# Patient Record
Sex: Female | Born: 2005 | Race: Black or African American | Hispanic: No | Marital: Single | Smoking: Current every day smoker
Health system: Southern US, Community
[De-identification: ages and names within clinical notes are randomized; demographics above are authoritative.]

## PROBLEM LIST (undated history)

## (undated) ENCOUNTER — Other Ambulatory Visit (HOSPITAL_COMMUNITY): Admission: EM | Payer: MEDICAID

## (undated) DIAGNOSIS — K5900 Constipation, unspecified: Secondary | ICD-10-CM

## (undated) DIAGNOSIS — F329 Major depressive disorder, single episode, unspecified: Secondary | ICD-10-CM

## (undated) DIAGNOSIS — F419 Anxiety disorder, unspecified: Secondary | ICD-10-CM

## (undated) DIAGNOSIS — F29 Unspecified psychosis not due to a substance or known physiological condition: Secondary | ICD-10-CM

## (undated) DIAGNOSIS — F909 Attention-deficit hyperactivity disorder, unspecified type: Secondary | ICD-10-CM

## (undated) DIAGNOSIS — F32A Depression, unspecified: Secondary | ICD-10-CM

## (undated) DIAGNOSIS — E669 Obesity, unspecified: Secondary | ICD-10-CM

## (undated) DIAGNOSIS — L309 Dermatitis, unspecified: Secondary | ICD-10-CM

## (undated) DIAGNOSIS — H539 Unspecified visual disturbance: Secondary | ICD-10-CM

## (undated) DIAGNOSIS — R0981 Nasal congestion: Secondary | ICD-10-CM

## (undated) DIAGNOSIS — J353 Hypertrophy of tonsils with hypertrophy of adenoids: Secondary | ICD-10-CM

## (undated) HISTORY — DX: Attention-deficit hyperactivity disorder, unspecified type: F90.9

## (undated) HISTORY — PX: TONSILLECTOMY: SUR1361

## (undated) HISTORY — DX: Obesity, unspecified: E66.9

---
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
## 2024-01-05 ENCOUNTER — Encounter (HOSPITAL_COMMUNITY): Payer: Self-pay

## 2024-01-05 ENCOUNTER — Ambulatory Visit (HOSPITAL_COMMUNITY): Payer: Medicaid Other | Admitting: Student in an Organized Health Care Education/Training Program

## 2024-01-05 NOTE — Progress Notes (Signed)
 Patient refusing to answer phone. Called Great grandmother. Patient has been staying in her room and eating on occasion. Patient has been refusing to answer any calls. Patient has been drawing, but is not using her phone. Patient did go to her therapist on Monday. Great-grandmother has also called [facility] to try and get her intensive therapy as well. Have again provided Grandmother the number to IOP that was left on [employer]' VM by [employer]. Grandmother has not been able to pick up the medication yet as well.   PGY-4 Reggie Rice, MD

## 2024-01-10 ENCOUNTER — Telehealth (HOSPITAL_COMMUNITY): Payer: Self-pay | Admitting: Professional

## 2024-01-15 ENCOUNTER — Telehealth (HOSPITAL_COMMUNITY): Payer: Self-pay | Admitting: Student in an Organized Health Care Education/Training Program

## 2024-01-15 ENCOUNTER — Telehealth (HOSPITAL_COMMUNITY): Payer: Self-pay | Admitting: Professional

## 2024-01-18 ENCOUNTER — Other Ambulatory Visit (HOSPITAL_COMMUNITY): Payer: Self-pay | Admitting: Psychiatry

## 2024-01-18 ENCOUNTER — Telehealth (HOSPITAL_COMMUNITY): Payer: Self-pay | Admitting: *Deleted

## 2024-01-18 ENCOUNTER — Telehealth (HOSPITAL_COMMUNITY): Payer: Self-pay

## 2024-01-18 DIAGNOSIS — F319 Bipolar disorder, unspecified: Secondary | ICD-10-CM

## 2024-01-18 MED ORDER — QUETIAPINE FUMARATE 200 MG PO TABS: 200.0000 mg | ORAL_TABLET | Freq: Every day | ORAL | 3 refills | Status: DC

## 2024-01-18 NOTE — Telephone Encounter (Signed)
Patients Grandmother called stating that she is unable to pick up patients Seroquel. She is asking for Seroquel 200mg . Medication is no longer on the patients active medication list. Also states that a prior authorization is needed per [facility]. Grandmother is very upset stating that she is getting worse and needs some help.
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
## 2024-01-25 ENCOUNTER — Other Ambulatory Visit (HOSPITAL_COMMUNITY): Payer: Self-pay

## 2024-01-25 ENCOUNTER — Telehealth (HOSPITAL_COMMUNITY): Payer: Self-pay

## 2024-01-25 NOTE — Telephone Encounter (Signed)
Pa has been started on this pt waiting for approval. 01/25/24

## 2024-01-25 NOTE — Telephone Encounter (Signed)
Pa approved today for Seroquel

## 2024-01-25 NOTE — Telephone Encounter (Signed)
Thank you for this update

## 2024-01-26 ENCOUNTER — Other Ambulatory Visit: Payer: Self-pay

## 2024-01-26 ENCOUNTER — Other Ambulatory Visit (HOSPITAL_COMMUNITY)
Admission: EM | Admit: 2024-01-26 | Discharge: 2024-01-29 | Disposition: A | Discharge status: Other Acute Inpatient Hospital | Payer: MEDICAID | Source: Home / Self Care | Admitting: Student in an Organized Health Care Education/Training Program

## 2024-01-26 ENCOUNTER — Other Ambulatory Visit (HOSPITAL_COMMUNITY): Admission: EM | Admit: 2024-01-26 | Discharge: 2024-01-26 | Payer: MEDICAID | Attending: Psychiatry

## 2024-01-26 ENCOUNTER — Encounter (HOSPITAL_COMMUNITY): Payer: Self-pay | Admitting: Student in an Organized Health Care Education/Training Program

## 2024-01-26 ENCOUNTER — Ambulatory Visit (INDEPENDENT_AMBULATORY_CARE_PROVIDER_SITE_OTHER): Payer: Medicaid Other | Admitting: Student in an Organized Health Care Education/Training Program

## 2024-01-26 VITALS — BP 140/79 | HR 91 | Wt 201.0 lb

## 2024-01-26 DIAGNOSIS — F411 Generalized anxiety disorder: Secondary | ICD-10-CM

## 2024-01-26 DIAGNOSIS — Z91148 Patient's other noncompliance with medication regimen for other reason: Secondary | ICD-10-CM | POA: Insufficient documentation

## 2024-01-26 DIAGNOSIS — F4321 Adjustment disorder with depressed mood: Secondary | ICD-10-CM | POA: Diagnosis not present

## 2024-01-26 DIAGNOSIS — F609 Personality disorder, unspecified: Secondary | ICD-10-CM

## 2024-01-26 DIAGNOSIS — Z9151 Personal history of suicidal behavior: Secondary | ICD-10-CM | POA: Insufficient documentation

## 2024-01-26 DIAGNOSIS — Z62810 Personal history of physical and sexual abuse in childhood: Secondary | ICD-10-CM | POA: Insufficient documentation

## 2024-01-26 DIAGNOSIS — F4010 Social phobia, unspecified: Secondary | ICD-10-CM | POA: Insufficient documentation

## 2024-01-26 DIAGNOSIS — F4310 Post-traumatic stress disorder, unspecified: Secondary | ICD-10-CM

## 2024-01-26 DIAGNOSIS — F319 Bipolar disorder, unspecified: Secondary | ICD-10-CM

## 2024-01-26 DIAGNOSIS — F1220 Cannabis dependence, uncomplicated: Secondary | ICD-10-CM

## 2024-01-26 DIAGNOSIS — R45851 Suicidal ideations: Secondary | ICD-10-CM | POA: Insufficient documentation

## 2024-01-26 DIAGNOSIS — R4588 Nonsuicidal self-harm: Secondary | ICD-10-CM

## 2024-01-26 DIAGNOSIS — Z79899 Other long term (current) drug therapy: Secondary | ICD-10-CM | POA: Insufficient documentation

## 2024-01-26 DIAGNOSIS — Z658 Other specified problems related to psychosocial circumstances: Secondary | ICD-10-CM | POA: Insufficient documentation

## 2024-01-26 DIAGNOSIS — F6089 Other specific personality disorders: Secondary | ICD-10-CM | POA: Insufficient documentation

## 2024-01-26 DIAGNOSIS — F3160 Bipolar disorder, current episode mixed, unspecified: Secondary | ICD-10-CM | POA: Insufficient documentation

## 2024-01-26 DIAGNOSIS — J45909 Unspecified asthma, uncomplicated: Secondary | ICD-10-CM | POA: Diagnosis present

## 2024-01-26 DIAGNOSIS — F909 Attention-deficit hyperactivity disorder, unspecified type: Secondary | ICD-10-CM | POA: Insufficient documentation

## 2024-01-26 DIAGNOSIS — F1210 Cannabis abuse, uncomplicated: Secondary | ICD-10-CM

## 2024-01-26 DIAGNOSIS — F3110 Bipolar disorder, current episode manic without psychotic features, unspecified: Secondary | ICD-10-CM | POA: Diagnosis not present

## 2024-01-26 DIAGNOSIS — F3130 Bipolar disorder, current episode depressed, mild or moderate severity, unspecified: Secondary | ICD-10-CM | POA: Diagnosis present

## 2024-01-26 LAB — COMPREHENSIVE METABOLIC PANEL
ALT: 21 U/L (ref 0–44)
AST: 19 U/L (ref 15–41)
Albumin: 3.5 g/dL (ref 3.5–5.0)
Alkaline Phosphatase: 76 U/L (ref 38–126)
Anion gap: 10 (ref 5–15)
BUN: 8 mg/dL (ref 6–20)
CO2: 21 mmol/L — ABNORMAL LOW (ref 22–32)
Calcium: 9.4 mg/dL (ref 8.9–10.3)
Chloride: 108 mmol/L (ref 98–111)
Creatinine, Ser: 0.71 mg/dL (ref 0.44–1.00)
GFR, Estimated: >60 mL/min (ref >60)
Glucose, Bld: 121 mg/dL — ABNORMAL HIGH (ref 70–99)
Potassium: 4.6 mmol/L (ref 3.5–5.1)
Sodium: 139 mmol/L (ref 135–145)
Total Bilirubin: <0.2 mg/dL (ref 0.0–1.2)
Total Protein: 6.7 g/dL (ref 6.5–8.1)

## 2024-01-26 LAB — CBC WITH DIFFERENTIAL/PLATELET
Abs Immature Granulocytes: 0.01 10*3/uL (ref 0.00–0.07)
Basophils Absolute: 0.1 10*3/uL (ref 0.0–0.1)
Basophils Relative: 1 %
Eosinophils Absolute: 0.3 10*3/uL (ref 0.0–0.5)
Eosinophils Relative: 3 %
HCT: 38.5 % (ref 36.0–46.0)
Hemoglobin: 12.5 g/dL (ref 12.0–15.0)
Immature Granulocytes: 0 %
Lymphocytes Relative: 41 %
Lymphs Abs: 3.3 10*3/uL (ref 0.7–4.0)
MCH: 26.8 pg (ref 26.0–34.0)
MCHC: 32.5 g/dL (ref 30.0–36.0)
MCV: 82.6 fL (ref 80.0–100.0)
Monocytes Absolute: 0.3 10*3/uL (ref 0.1–1.0)
Monocytes Relative: 4 %
Neutro Abs: 4 10*3/uL (ref 1.7–7.7)
Neutrophils Relative %: 51 %
Platelets: 272 10*3/uL (ref 150–400)
RBC: 4.66 MIL/uL (ref 3.87–5.11)
RDW: 14.5 % (ref 11.5–15.5)
WBC: 7.9 10*3/uL (ref 4.0–10.5)
nRBC: 0 % (ref 0.0–0.2)

## 2024-01-26 LAB — TSH: TSH: 1.357 u[IU]/mL (ref 0.350–4.500)

## 2024-01-26 MED ORDER — QUETIAPINE FUMARATE 200 MG PO TABS: 200.0000 mg | ORAL_TABLET | Freq: Every day | ORAL | Status: DC

## 2024-01-26 MED ORDER — ACETAMINOPHEN 325 MG PO TABS: 650.0000 mg | ORAL_TABLET | Freq: Four times a day (QID) | ORAL | Status: DC | PRN

## 2024-01-26 MED ORDER — HYDROXYZINE HCL 25 MG PO TABS: 25.0000 mg | ORAL_TABLET | Freq: Three times a day (TID) | ORAL | Status: DC | PRN

## 2024-01-26 MED ORDER — FLUOXETINE HCL 20 MG PO CAPS: 20.0000 mg | ORAL_CAPSULE | Freq: Every day | ORAL | Status: DC

## 2024-01-26 MED ORDER — TRAZODONE HCL 100 MG PO TABS: 100.0000 mg | ORAL_TABLET | Freq: Every evening | ORAL | Status: DC | PRN

## 2024-01-26 MED ORDER — ALUM & MAG HYDROXIDE-SIMETH 200-200-20 MG/5ML PO SUSP
30.0000 mL | ORAL | Status: DC | PRN
Start: 2024-01-26 — End: 2024-01-29
  Administered 2024-01-27: 30 mL via ORAL
  Filled 2024-01-26: qty 30

## 2024-01-26 MED ORDER — MAGNESIUM HYDROXIDE 400 MG/5ML PO SUSP: 30.0000 mL | Freq: Every day | ORAL | Status: DC | PRN

## 2024-01-26 MED ORDER — QUETIAPINE FUMARATE 100 MG PO TABS: 100.0000 mg | ORAL_TABLET | Freq: Every day | ORAL | Status: DC

## 2024-01-26 MED ORDER — MAGNESIUM HYDROXIDE 400 MG/5ML PO SUSP
30.0000 mL | Freq: Every day | ORAL | Status: DC | PRN
Start: 2024-01-26 — End: 2024-01-29

## 2024-01-26 MED ORDER — DIPHENHYDRAMINE HCL 25 MG PO CAPS: 25.0000 mg | ORAL_CAPSULE | Freq: Four times a day (QID) | ORAL | Status: DC | PRN

## 2024-01-26 MED ORDER — ALBUTEROL SULFATE HFA 108 (90 BASE) MCG/ACT IN AERS: 2.0000 | INHALATION_SPRAY | RESPIRATORY_TRACT | Status: DC | PRN

## 2024-01-26 MED ORDER — CHOLECALCIFEROL 10 MCG (400 UNIT) PO TABS
400.0000 [IU] | ORAL_TABLET | Freq: Every day | ORAL | Status: DC
  Administered 2024-01-26 – 2024-01-29 (×4): 400 [IU] via ORAL
  Filled 2024-01-26 (×4): qty 1

## 2024-01-26 MED ORDER — QUETIAPINE FUMARATE 100 MG PO TABS
100.0000 mg | ORAL_TABLET | Freq: Every day | ORAL | Status: DC
  Administered 2024-01-26 – 2024-01-27 (×2): 100 mg via ORAL
  Filled 2024-01-26 (×2): qty 1

## 2024-01-26 MED ORDER — FLUOXETINE HCL 20 MG PO CAPS
20.0000 mg | ORAL_CAPSULE | Freq: Every day | ORAL | Status: DC
  Administered 2024-01-26 – 2024-01-28 (×3): 20 mg via ORAL
  Filled 2024-01-26 (×3): qty 1

## 2024-01-26 MED ORDER — TRAZODONE HCL 100 MG PO TABS
100.0000 mg | ORAL_TABLET | Freq: Every day | ORAL | Status: DC
  Administered 2024-01-27: 100 mg via ORAL
  Filled 2024-01-26 (×2): qty 1

## 2024-01-26 MED ORDER — MOMETASONE FURO-FORMOTEROL FUM 100-5 MCG/ACT IN AERO: 2.0000 | INHALATION_SPRAY | Freq: Once | RESPIRATORY_TRACT | Status: DC

## 2024-01-26 MED ORDER — POLYETHYLENE GLYCOL 3350 17 G PO PACK: 17.0000 g | PACK | Freq: Every day | ORAL | Status: DC | PRN

## 2024-01-26 MED ORDER — ALBUTEROL SULFATE HFA 108 (90 BASE) MCG/ACT IN AERS: 2.0000 | INHALATION_SPRAY | Freq: Four times a day (QID) | RESPIRATORY_TRACT | Status: DC | PRN

## 2024-01-26 MED ORDER — ALUM & MAG HYDROXIDE-SIMETH 200-200-20 MG/5ML PO SUSP: 30.0000 mL | ORAL | Status: DC | PRN

## 2024-01-26 MED ORDER — TRAZODONE HCL 50 MG PO TABS
50.0000 mg | ORAL_TABLET | Freq: Every evening | ORAL | Status: DC | PRN
  Administered 2024-01-26: 50 mg via ORAL
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
## 2024-01-26 NOTE — Discharge Instructions (Signed)
 Transfer to [facility]

## 2024-01-26 NOTE — [facility] Group Notes (Signed)
SPIRITUALITY GROUP NOTE  Spirituality group facilitated by Wilkie Aye, MDiv, [facility].  Group Description: Group focused on topic of hope. Patients participated in facilitated discussion around topic, connecting with one another around experiences and definitions for hope. Group members engaged with visual explorer photos, reflecting on what hope looks like for them today. Group engaged in discussion around how their definitions of hope are present today in hospital.  Modalities: Psycho-social ed, Adlerian, Narrative, MI  Patient Progress: Present throughout group.  Named feeling disconnected from word hope.
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
## 2024-01-26 NOTE — ED Notes (Signed)
Pt is transferred to [facility].

## 2024-01-26 NOTE — ED Notes (Signed)
Patient is adjusting well to unit.  She is calm and pleasant without distress or complaint.  Makes needs known.  Will monitor.

## 2024-01-26 NOTE — ED Notes (Signed)
Pt isolates to room, a/o, flat affect. Denies SI/. HI/AVH. Contracts for safety. No noted distress. Will continue to monitor for safety

## 2024-01-26 NOTE — ED Notes (Signed)
Patient admitted to [facility] from out patient provider due to medication non adherence and family issues.  Patient calm and cooperative with admission process.  Soft spoken and agreeable.  Patient was oriented to the unit and shown to her room.  She was then given lunch.  She is now sitting in the day room eating.  Patient denied avh shi or plan and will seek staff if she is overwhelmed.  Patient has many healed scars on arms and both thighs from cutting however she states she has not done this in a long time.  No visible fresh cuts. Patient also reports smoking THC daily.  Will monitor and provide a safe environment.

## 2024-01-27 DIAGNOSIS — F3110 Bipolar disorder, current episode manic without psychotic features, unspecified: Secondary | ICD-10-CM | POA: Diagnosis not present

## 2024-01-27 DIAGNOSIS — F4310 Post-traumatic stress disorder, unspecified: Secondary | ICD-10-CM | POA: Diagnosis not present

## 2024-01-27 DIAGNOSIS — F909 Attention-deficit hyperactivity disorder, unspecified type: Secondary | ICD-10-CM | POA: Diagnosis not present

## 2024-01-27 DIAGNOSIS — F411 Generalized anxiety disorder: Secondary | ICD-10-CM | POA: Diagnosis not present

## 2024-01-27 LAB — POCT URINE DRUG SCREEN - MANUAL ENTRY (I-SCREEN)
POC Amphetamine UR: NOT DETECTED
POC Buprenorphine (BUP): NOT DETECTED
POC Cocaine UR: NOT DETECTED
POC Marijuana UR: POSITIVE — AB
POC Methadone UR: NOT DETECTED
POC Methamphetamine UR: NOT DETECTED
POC Morphine: NOT DETECTED
POC Oxazepam (BZO): NOT DETECTED
POC Oxycodone UR: NOT DETECTED
POC Secobarbital (BAR): NOT DETECTED

## 2024-01-27 LAB — POC URINE PREG, ED: Preg Test, Ur: NEGATIVE

## 2024-01-27 MED ORDER — HYDROXYZINE HCL 25 MG PO TABS
25.0000 mg | ORAL_TABLET | Freq: Three times a day (TID) | ORAL | Status: DC | PRN
  Administered 2024-01-28 (×2): 25 mg via ORAL
  Filled 2024-01-27 (×2): qty 1

## 2024-01-27 NOTE — ED Notes (Signed)
Pt observed/assessed in room sleeping. RR even and unlabored, appearing in no noted distress. Environmental check complete, will continue to monitor for safety 
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
## 2024-01-27 NOTE — ED Notes (Signed)
Providers made aware of B/P 104/84 with fluid encouragement.

## 2024-01-27 NOTE — ED Notes (Signed)
Patient resting quietly in bed, easily aroused. Patient unable to express mood citing "I don't know". Patient affect is flat. Patient denies SI, HI, AVH. Patient does not appear to be responsive to internal stimuli. Patient requested to rest today.
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
## 2024-01-27 NOTE — ED Notes (Signed)
Pt complained of the group topics we've been having, she feels left out because most of the all related to addiction.. Pt denies SI/HI/AVH. No acute distress noted. Will continue to monitor for safety and provide support.

## 2024-01-28 DIAGNOSIS — F909 Attention-deficit hyperactivity disorder, unspecified type: Secondary | ICD-10-CM | POA: Diagnosis not present

## 2024-01-28 DIAGNOSIS — F4310 Post-traumatic stress disorder, unspecified: Secondary | ICD-10-CM | POA: Diagnosis not present

## 2024-01-28 DIAGNOSIS — F4010 Social phobia, unspecified: Secondary | ICD-10-CM | POA: Diagnosis present

## 2024-01-28 DIAGNOSIS — F3110 Bipolar disorder, current episode manic without psychotic features, unspecified: Secondary | ICD-10-CM | POA: Diagnosis not present

## 2024-01-28 DIAGNOSIS — F411 Generalized anxiety disorder: Secondary | ICD-10-CM | POA: Diagnosis not present

## 2024-01-28 DIAGNOSIS — J45909 Unspecified asthma, uncomplicated: Secondary | ICD-10-CM | POA: Diagnosis present

## 2024-01-28 MED ORDER — QUETIAPINE FUMARATE 50 MG PO TABS
50.0000 mg | ORAL_TABLET | Freq: Every day | ORAL | Status: DC
  Administered 2024-01-28: 50 mg via ORAL
  Filled 2024-01-28: qty 1

## 2024-01-28 MED ORDER — CLONIDINE HCL 0.1 MG PO TABS
0.1000 mg | ORAL_TABLET | ORAL | Status: DC
  Administered 2024-01-28 – 2024-01-29 (×4): 0.1 mg via ORAL
  Filled 2024-01-28 (×5): qty 1

## 2024-01-28 MED ORDER — TRAZODONE HCL 50 MG PO TABS
50.0000 mg | ORAL_TABLET | Freq: Every day | ORAL | Status: DC
  Administered 2024-01-28: 50 mg via ORAL
  Filled 2024-01-28: qty 1

## 2024-01-28 MED ORDER — CLONIDINE HCL 0.1 MG PO TABS
0.1000 mg | ORAL_TABLET | Freq: Three times a day (TID) | ORAL | Status: DC | PRN
  Administered 2024-01-28: 0.1 mg via ORAL
  Filled 2024-01-28: qty 1

## 2024-01-28 MED ORDER — FLUOXETINE HCL 20 MG PO CAPS
40.0000 mg | ORAL_CAPSULE | ORAL | Status: DC
Start: 2024-01-29 — End: 2024-01-29
  Administered 2024-01-29: 40 mg via ORAL
  Filled 2024-01-28: qty 2
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
## 2024-01-28 NOTE — ED Notes (Addendum)
 Patient is sleeping. Respirations equal and unlabored, skin warm and dry. No change in assessment or acuity. Routine safety checks conducted according to facility protocol. Will continue to monitor for safety.

## 2024-01-28 NOTE — ED Notes (Signed)
Patient A&O x 4, calm, cooperative. Patient reports slight irritability from hearing a basketball bounce when attempting to lay down. The patient endorses she knows it's not realistic. Patient also endorses restless r/t the situation of the basketball. Patient observed to be teary-eyed and anxious when talking of the basketball. Patient denies SI, HI, VH. Patient allowed to voice concerns. Morning medications with PRN Atarax administered.

## 2024-01-28 NOTE — ED Notes (Signed)
 Patient was provided lunch
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
## 2024-01-28 NOTE — ED Notes (Signed)
 Patient is sleeping. Respirations equal and unlabored, skin warm and dry. No change in assessment or acuity. Routine safety checks conducted according to facility protocol. Will continue to monitor for safety.
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
## 2024-01-28 NOTE — ED Notes (Addendum)
Pt is in the bedroom calm, awake and composed, still laying on the floor. Respirations are even and unlabored. NAD unlabored. Will continue to monitor for safety.

## 2024-01-28 NOTE — ED Notes (Signed)
Patient on the phone talking to a family member. Pleasant looking at the moment. NAD, respirations are even and unlabored. Will monitor for safety.

## 2024-01-29 DIAGNOSIS — F909 Attention-deficit hyperactivity disorder, unspecified type: Secondary | ICD-10-CM | POA: Diagnosis not present

## 2024-01-29 DIAGNOSIS — F3110 Bipolar disorder, current episode manic without psychotic features, unspecified: Secondary | ICD-10-CM | POA: Diagnosis not present

## 2024-01-29 DIAGNOSIS — F411 Generalized anxiety disorder: Secondary | ICD-10-CM | POA: Diagnosis not present

## 2024-01-29 DIAGNOSIS — F4310 Post-traumatic stress disorder, unspecified: Secondary | ICD-10-CM | POA: Diagnosis not present

## 2024-01-29 MED ORDER — TRAZODONE HCL 50 MG PO TABS: 50.0000 mg | ORAL_TABLET | Freq: Every day | ORAL | Status: DC

## 2024-01-29 MED ORDER — FLUOXETINE HCL 40 MG PO CAPS: 40.0000 mg | ORAL_CAPSULE | ORAL | Status: DC

## 2024-01-29 MED ORDER — QUETIAPINE FUMARATE 50 MG PO TABS: 50.0000 mg | ORAL_TABLET | Freq: Every day | ORAL | Status: DC

## 2024-01-29 NOTE — ED Notes (Signed)
Patient remains asleep in bed.  She did not want to eat breakfast this morning.  Patient observed with mattress on the floor where she put it last night after believing someone was "playing basketball" in her room.  Patient without distress at this time. Will monitor.
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
## 2024-01-29 NOTE — Discharge Instructions (Addendum)
 Please continue to take medications as directed. If your symptoms return, worsen, or persist please call your 911, report to local ER, or contact crisis hotline. Please do not drink alcohol or use any illegal substances while taking prescription medications.

## 2024-01-29 NOTE — Progress Notes (Signed)
Pt has been accepted to [facility] on 01/29/2024 Bed assignment: 600 UNIT   Pt meets inpatient criteria per: Estill Cotta NP  Attending Physician will be: Sherrian Divers MD  Report can be called to: [phone]  Pt can arrive ASAP  Care Team Notified: Estill Cotta NP Staci Acosta LCSWA   [place] Addisyn Leclaire LCSW-A   01/29/2024 3:20 PM

## 2024-01-29 NOTE — ED Notes (Signed)
Pt is in the bedroom calm and sleeping. NAD Respirations are even and unlabored. Will continue to monitor for safety.
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
## 2024-01-29 NOTE — Progress Notes (Signed)
LCSW Progress Note  [id number]   [name]  01/29/2024  2:20 PM  Description:   Inpatient Psychiatric Referral  Patient was recommended inpatient per Estill Cotta NP). There are no available beds at [facility], per [facility] Rona Ravens RN. Patient was referred to the following out of network facilities:    Destination  Service Provider Address Phone Fax  [facility] Patient Placement [facility], [place] [phone]  [facility] [address] [place] [address] [phone]  [facility] [address], [place] [address] [phone]  [facility] [address] [place], [place] [address] [phone]  [facility] [address] [place], [place] [address] [phone]  [facility] [address] [place] [address] [phone]  [facility] [address] [place], [place] [address] [phone]  [facility] [address], [place] [address] [phone]  [facility] [address], [place] [address] [phone]  [facility] [address], [place] [address] [phone]  [facility] [address] [place] [address] [phone]  [facility] EFAX [address] [place], [place] [phone]  [facility] [address], [place] [address] [phone]  [facility] [address] [place] [address] [phone]  [facility] [address] [place] [address] [phone]      Situation ongoing, CSW to continue following and  update chart as more information becomes available.      [place] Cyanna Neace, MSW, LCSW  01/29/2024 2:20 PM
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
## 2024-01-30 ENCOUNTER — Telehealth (HOSPITAL_COMMUNITY): Payer: Self-pay | Admitting: Student in an Organized Health Care Education/Training Program

## 2024-02-02 NOTE — Telephone Encounter (Signed)
 Unfortunately, I do not have a ROI to speak with the grandmother and I am also not the provider who facilitated the transfer or care for the patient that great-grandmother is referring to.

## 2024-02-16 ENCOUNTER — Other Ambulatory Visit (HOSPITAL_COMMUNITY): Payer: Self-pay | Admitting: Student in an Organized Health Care Education/Training Program

## 2024-02-16 ENCOUNTER — Ambulatory Visit (HOSPITAL_COMMUNITY): Payer: MEDICAID | Admitting: Student in an Organized Health Care Education/Training Program

## 2024-02-16 ENCOUNTER — Encounter (HOSPITAL_COMMUNITY): Payer: Self-pay

## 2024-02-16 ENCOUNTER — Telehealth (HOSPITAL_COMMUNITY): Payer: Self-pay | Admitting: Student in an Organized Health Care Education/Training Program

## 2024-02-16 DIAGNOSIS — F609 Personality disorder, unspecified: Secondary | ICD-10-CM

## 2024-02-16 DIAGNOSIS — F3110 Bipolar disorder, current episode manic without psychotic features, unspecified: Secondary | ICD-10-CM

## 2024-02-16 DIAGNOSIS — F4310 Post-traumatic stress disorder, unspecified: Secondary | ICD-10-CM

## 2024-02-16 MED ORDER — QUETIAPINE FUMARATE 50 MG PO TABS: 50.0000 mg | ORAL_TABLET | Freq: Every day | ORAL | 0 refills | Status: DC

## 2024-02-16 MED ORDER — TRAZODONE HCL 50 MG PO TABS
50.0000 mg | ORAL_TABLET | Freq: Every day | ORAL | 0 refills | Status: DC
Start: 2024-02-16 — End: 2024-02-21

## 2024-02-16 MED ORDER — FLUOXETINE HCL 40 MG PO CAPS: 40.0000 mg | ORAL_CAPSULE | ORAL | 0 refills | Status: DC

## 2024-02-16 NOTE — Progress Notes (Signed)
 Patinet missed apt and is now returning to need for walk-in to get appt.Will send in 30 day supply of medications at the doses last recorded when patinet was in [facility].   PGY-4  Eliseo Gum, MD

## 2024-02-16 NOTE — Telephone Encounter (Signed)
 Provider called grandmother. Grandmother reports that patient is having more outbursts and is upset that she was hospitalized. Grandmother reports that patient is refusing to attend therapy and psychiatric appts. Did discuss that patient will have to come back as a walk-in. [name] understood. [name] also endorsed that she has been talking to [facility] with Case Manager about additional social services. Patient will be supplied with a 30 day supply of her medications per protocol.

## 2024-02-16 NOTE — Progress Notes (Unsigned)
 Patient Randie Heinz grandmother called to make appt virtual. Provider attempted to contact patient 2x by phone and sent virtual link to patient's phone and patient did not answer.    PGY-4 Eliseo Gum, MD

## 2024-02-19 ENCOUNTER — Telehealth (HOSPITAL_COMMUNITY): Payer: Self-pay

## 2024-02-19 DIAGNOSIS — F3110 Bipolar disorder, current episode manic without psychotic features, unspecified: Secondary | ICD-10-CM

## 2024-02-19 NOTE — Telephone Encounter (Signed)
 Hello,    Pt/ Pharmacy is requesting for a refill of 30.0 TRAZODONE 50 MG .  Next app 3/10, last seen 02/16/2024

## 2024-02-21 ENCOUNTER — Ambulatory Visit (HOSPITAL_COMMUNITY)
Admission: EM | Admit: 2024-02-21 | Discharge: 2024-02-22 | Disposition: A | Discharge status: Home / Self Care | Payer: MEDICAID | Attending: Nurse Practitioner | Admitting: Nurse Practitioner

## 2024-02-21 DIAGNOSIS — F4310 Post-traumatic stress disorder, unspecified: Secondary | ICD-10-CM | POA: Insufficient documentation

## 2024-02-21 DIAGNOSIS — Z79899 Other long term (current) drug therapy: Secondary | ICD-10-CM | POA: Insufficient documentation

## 2024-02-21 DIAGNOSIS — F319 Bipolar disorder, unspecified: Secondary | ICD-10-CM | POA: Insufficient documentation

## 2024-02-21 DIAGNOSIS — F488 Other specified nonpsychotic mental disorders: Secondary | ICD-10-CM | POA: Diagnosis not present

## 2024-02-21 DIAGNOSIS — F3110 Bipolar disorder, current episode manic without psychotic features, unspecified: Secondary | ICD-10-CM

## 2024-02-21 DIAGNOSIS — R4589 Other symptoms and signs involving emotional state: Secondary | ICD-10-CM

## 2024-02-21 LAB — COMPREHENSIVE METABOLIC PANEL
ALT: 19 U/L (ref 0–44)
AST: 19 U/L (ref 15–41)
Albumin: 3.8 g/dL (ref 3.5–5.0)
Alkaline Phosphatase: 71 U/L (ref 38–126)
Anion gap: 17 — ABNORMAL HIGH (ref 5–15)
BUN: 7 mg/dL (ref 6–20)
CO2: 19 mmol/L — ABNORMAL LOW (ref 22–32)
Calcium: 10.3 mg/dL (ref 8.9–10.3)
Chloride: 105 mmol/L (ref 98–111)
Creatinine, Ser: 0.8 mg/dL (ref 0.44–1.00)
GFR, Estimated: >60 mL/min (ref >60)
Glucose, Bld: 88 mg/dL (ref 70–99)
Potassium: 4 mmol/L (ref 3.5–5.1)
Sodium: 141 mmol/L (ref 135–145)
Total Bilirubin: 0.4 mg/dL (ref 0.0–1.2)
Total Protein: 7.6 g/dL (ref 6.5–8.1)

## 2024-02-21 LAB — CBC WITH DIFFERENTIAL/PLATELET
Abs Immature Granulocytes: 0.04 10*3/uL (ref 0.00–0.07)
Basophils Absolute: 0.1 10*3/uL (ref 0.0–0.1)
Basophils Relative: 1 %
Eosinophils Absolute: 0 10*3/uL (ref 0.0–0.5)
Eosinophils Relative: 0 %
HCT: 39 % (ref 36.0–46.0)
Hemoglobin: 12.7 g/dL (ref 12.0–15.0)
Immature Granulocytes: 0 %
Lymphocytes Relative: 26 %
Lymphs Abs: 2.7 10*3/uL (ref 0.7–4.0)
MCH: 26.6 pg (ref 26.0–34.0)
MCHC: 32.6 g/dL (ref 30.0–36.0)
MCV: 81.8 fL (ref 80.0–100.0)
Monocytes Absolute: 0.4 10*3/uL (ref 0.1–1.0)
Monocytes Relative: 4 %
Neutro Abs: 7.1 10*3/uL (ref 1.7–7.7)
Neutrophils Relative %: 69 %
Platelets: 339 10*3/uL (ref 150–400)
RBC: 4.77 MIL/uL (ref 3.87–5.11)
RDW: 14.2 % (ref 11.5–15.5)
WBC: 10.3 10*3/uL (ref 4.0–10.5)
nRBC: 0 % (ref 0.0–0.2)

## 2024-02-21 LAB — HEMOGLOBIN A1C
Hgb A1c MFr Bld: 5.2 % (ref 4.8–5.6)
Mean Plasma Glucose: 102.54 mg/dL

## 2024-02-21 LAB — LIPID PANEL
Cholesterol: 125 mg/dL (ref 0–169)
HDL: 48 mg/dL (ref >40)
LDL Cholesterol: 66 mg/dL (ref 0–99)
Total CHOL/HDL Ratio: 2.6 ratio
Triglycerides: 57 mg/dL (ref <150)
VLDL: 11 mg/dL (ref 0–40)

## 2024-02-21 LAB — TSH: TSH: 2.661 u[IU]/mL (ref 0.350–4.500)

## 2024-02-21 LAB — ETHANOL: Alcohol, Ethyl (B): <10 mg/dL (ref <10)

## 2024-02-21 MED ORDER — LORAZEPAM 1 MG PO TABS: 1.0000 mg | ORAL_TABLET | Freq: Once | ORAL | Status: DC

## 2024-02-21 MED ORDER — LORAZEPAM 2 MG/ML IJ SOLN: 2.0000 mg | Freq: Three times a day (TID) | INTRAMUSCULAR | Status: DC | PRN

## 2024-02-21 MED ORDER — ARIPIPRAZOLE 15 MG PO TABS
15.0000 mg | ORAL_TABLET | Freq: Once | ORAL | Status: AC
  Administered 2024-02-21: 15 mg via ORAL
  Filled 2024-02-21: qty 1

## 2024-02-21 MED ORDER — DIPHENHYDRAMINE HCL 50 MG PO CAPS: 50.0000 mg | ORAL_CAPSULE | Freq: Three times a day (TID) | ORAL | Status: DC | PRN

## 2024-02-21 MED ORDER — ARIPIPRAZOLE 15 MG PO TABS
15.0000 mg | ORAL_TABLET | Freq: Every day | ORAL | Status: DC
  Administered 2024-02-22: 15 mg via ORAL
  Filled 2024-02-21: qty 1

## 2024-02-21 MED ORDER — TRAZODONE HCL 50 MG PO TABS: 50.0000 mg | ORAL_TABLET | Freq: Every day | ORAL | 0 refills | Status: DC

## 2024-02-21 MED ORDER — TRAZODONE HCL 50 MG PO TABS
50.0000 mg | ORAL_TABLET | Freq: Every evening | ORAL | Status: DC | PRN
  Filled 2024-02-21: qty 1

## 2024-02-21 MED ORDER — DIPHENHYDRAMINE HCL 50 MG/ML IJ SOLN: 50.0000 mg | Freq: Three times a day (TID) | INTRAMUSCULAR | Status: DC | PRN

## 2024-02-21 MED ORDER — FLUOXETINE HCL 40 MG PO CAPS
40.0000 mg | ORAL_CAPSULE | ORAL | 0 refills | Status: DC
Start: 2024-02-21 — End: 2024-02-22

## 2024-02-21 MED ORDER — QUETIAPINE FUMARATE 50 MG PO TABS: 50.0000 mg | ORAL_TABLET | Freq: Every day | ORAL | 0 refills | Status: DC

## 2024-02-21 MED ORDER — NICOTINE 21 MG/24HR TD PT24
21.0000 mg | MEDICATED_PATCH | Freq: Every day | TRANSDERMAL | Status: DC
  Administered 2024-02-22: 21 mg via TRANSDERMAL
  Filled 2024-02-21: qty 1

## 2024-02-21 MED ORDER — HALOPERIDOL LACTATE 5 MG/ML IJ SOLN: 5.0000 mg | Freq: Three times a day (TID) | INTRAMUSCULAR | Status: DC | PRN

## 2024-02-21 MED ORDER — MAGNESIUM HYDROXIDE 400 MG/5ML PO SUSP: 30.0000 mL | Freq: Every day | ORAL | Status: DC | PRN

## 2024-02-21 MED ORDER — HALOPERIDOL LACTATE 5 MG/ML IJ SOLN: 10.0000 mg | Freq: Three times a day (TID) | INTRAMUSCULAR | Status: DC | PRN

## 2024-02-21 MED ORDER — FLUOXETINE HCL 20 MG PO CAPS
20.0000 mg | ORAL_CAPSULE | Freq: Every day | ORAL | Status: DC
  Administered 2024-02-21 – 2024-02-22 (×2): 20 mg via ORAL
  Filled 2024-02-21 (×2): qty 1

## 2024-02-21 MED ORDER — LORAZEPAM 1 MG PO TABS
2.0000 mg | ORAL_TABLET | Freq: Once | ORAL | Status: AC
  Administered 2024-02-21: 2 mg via ORAL
  Filled 2024-02-21: qty 2

## 2024-02-21 MED ORDER — HALOPERIDOL 5 MG PO TABS: 5.0000 mg | ORAL_TABLET | Freq: Three times a day (TID) | ORAL | Status: DC | PRN

## 2024-02-21 MED ORDER — ACETAMINOPHEN 325 MG PO TABS: 650.0000 mg | ORAL_TABLET | Freq: Four times a day (QID) | ORAL | Status: DC | PRN

## 2024-02-21 MED ORDER — ALUM & MAG HYDROXIDE-SIMETH 200-200-20 MG/5ML PO SUSP: 30.0000 mL | ORAL | Status: DC | PRN

## 2024-02-21 MED ORDER — ARIPIPRAZOLE ER 400 MG IM PRSY
400.0000 mg | PREFILLED_SYRINGE | INTRAMUSCULAR | Status: DC
  Administered 2024-02-22: 400 mg via INTRAMUSCULAR

## 2024-02-21 MED ORDER — HYDROXYZINE HCL 25 MG PO TABS
25.0000 mg | ORAL_TABLET | Freq: Three times a day (TID) | ORAL | Status: DC | PRN
Start: 2024-02-21 — End: 2024-02-22
  Administered 2024-02-22: 25 mg via ORAL
  Filled 2024-02-21: qty 1

## 2024-02-21 NOTE — Addendum Note (Signed)
 Addended by: Lauro Franklin on: 02/21/2024 11:52 AM   Modules accepted: Orders

## 2024-02-21 NOTE — Telephone Encounter (Signed)
 Pts Grandmother called Again today stating that she needs an order of Seroquel and Prozac refill urgently for her granddaughter. Pt states that the Seroquel is the wrong dosage as well and needs to be corrected.
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
## 2024-02-21 NOTE — ED Notes (Signed)
 Pt sleeping@this  time breathing even and unlabored will continue to monitor for safety
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
## 2024-02-21 NOTE — Telephone Encounter (Signed)
 Covering inbox for provider.  Received message that patient had not received scripts that were sent on 2/21.  Will send in a one month refill of medications to bridge until her appointment on 3/10.   Sent: -Prozac 40 mg daily.  30 capsules with 0 refills. -Seroquel 50 mg QHS.  30 tablets with 0 refills. -Trazodone 50 mg QHS.  30 tablets with 0 refills.    Arna Snipe MD Resident
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
## 2024-02-22 LAB — POCT URINE DRUG SCREEN - MANUAL ENTRY (I-SCREEN)
POC Amphetamine UR: NOT DETECTED
POC Buprenorphine (BUP): NOT DETECTED
POC Cocaine UR: NOT DETECTED
POC Marijuana UR: POSITIVE — AB
POC Methadone UR: NOT DETECTED
POC Methamphetamine UR: NOT DETECTED
POC Morphine: NOT DETECTED
POC Oxazepam (BZO): NOT DETECTED
POC Oxycodone UR: NOT DETECTED
POC Secobarbital (BAR): NOT DETECTED

## 2024-02-22 LAB — URINALYSIS, ROUTINE W REFLEX MICROSCOPIC
Bilirubin Urine: NEGATIVE
Glucose, UA: NEGATIVE mg/dL
Hgb urine dipstick: NEGATIVE
Ketones, ur: 5 mg/dL — AB
Leukocytes,Ua: NEGATIVE
Nitrite: NEGATIVE
Protein, ur: NEGATIVE mg/dL
Specific Gravity, Urine: 1.026 (ref 1.005–1.030)
pH: 5 (ref 5.0–8.0)

## 2024-02-22 LAB — POC URINE PREG, ED: Preg Test, Ur: NEGATIVE

## 2024-02-22 LAB — VITAMIN D 25 HYDROXY (VIT D DEFICIENCY, FRACTURES): Vit D, 25-Hydroxy: 54.92 ng/mL (ref 30–100)

## 2024-02-22 LAB — VITAMIN B12: Vitamin B-12: 508 pg/mL (ref 180–914)

## 2024-02-22 MED ORDER — NICOTINE 21 MG/24HR TD PT24: 21.0000 mg | MEDICATED_PATCH | Freq: Every day | TRANSDERMAL | 0 refills | Status: DC

## 2024-02-22 MED ORDER — FLUOXETINE HCL 40 MG PO CAPS: 40.0000 mg | ORAL_CAPSULE | ORAL | 0 refills | Status: AC

## 2024-02-22 MED ORDER — ARIPIPRAZOLE ER 400 MG IM PRSY: 400.0000 mg | PREFILLED_SYRINGE | INTRAMUSCULAR | 0 refills | Status: AC

## 2024-02-22 MED ORDER — TRAZODONE HCL 50 MG PO TABS: 50.0000 mg | ORAL_TABLET | Freq: Every day | ORAL | 0 refills | Status: DC

## 2024-02-22 MED ORDER — ARIPIPRAZOLE 15 MG PO TABS
15.0000 mg | ORAL_TABLET | Freq: Every day | ORAL | 0 refills | Status: DC
Start: 2024-02-23 — End: 2024-05-15

## 2024-02-22 NOTE — ED Notes (Signed)
 Pt discharged with  AVS.  AVS reviewed prior to discharge.  Pt alert, oriented, and ambulatory.  Safety maintained.
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
## 2024-02-22 NOTE — ED Notes (Signed)
 Patient is resting with eyes closed without any distress noted. Staff will continue to monitor safety and for changes in condition.

## 2024-02-22 NOTE — Discharge Instructions (Addendum)
 Please follow up with your outpatient provider regarding your next appointment.  Below is the information for the [facility] Social services that you requested. You can call any of the numbers below based on what your needs are: The address is :[address] [place], [place] [address]  Guardianship [phone]  Social Workers provide comprehensive ongoing case management services to adults that have been adjudicated incompetent by the [employer] and for whom the Production assistant, radio has been appointed legal guardian.  Adult Protective Services Intake Line: [phone] After Hours: [phone]  Provides investigation of alleged abuse, neglect and exploitation for disabled adults. If substantiated, we provide services to improve/eliminate the situation with adult consent or meet with the legal system when the individual does not have capacity to consent to services.   Adult Representative Payee Services [phone]  Service provides financial management of SSA/SSI funds for adults who have been determined by the Social Security Administration as being incapable of managing their own benefits.  Special Assistance In-Home Program [phone]  Provides Medicaid eligible clients with a Special Assistance payment that will supplement their income in order to safely remain in their homes. Must meet eligibility requirements.  In-Home Aide Services [phone]  The program contracts with community home care agencies to provide in home services, when there is a need, such as light housekeeping and personal care services (bathing, hair washing, toileting) that assist clients/customers with safely remaining in their homes.

## 2024-02-22 NOTE — Progress Notes (Signed)
 Pt is asleep. Respirations are even and unlabored. No signs of acute distress noted. Staff will monitor for pt's safety.

## 2024-02-22 NOTE — Progress Notes (Signed)
 Pt is awake, alert and oriented with flat affect. Pt was tearful during assessment and reported feeling depressed. No signs of acute distress noted. Administered scheduled meds per order. Pt denies pain and current SI/HI/AVH, plan or intent. Staff will monitor for pt's safety.

## 2024-02-22 NOTE — Progress Notes (Signed)
 Administered Abilify Maintena 400mg  on pt's right deltoid. Pt tolerated well.

## 2024-02-27 ENCOUNTER — Telehealth (HOSPITAL_COMMUNITY): Payer: Self-pay | Admitting: Student in an Organized Health Care Education/Training Program

## 2024-02-28 ENCOUNTER — Other Ambulatory Visit: Payer: Self-pay

## 2024-02-28 ENCOUNTER — Emergency Department (HOSPITAL_COMMUNITY)
Admission: EM | Admit: 2024-02-28 | Discharge: 2024-02-28 | Discharge status: Against Medical Advice | Payer: MEDICAID | Attending: Emergency Medicine | Admitting: Emergency Medicine

## 2024-02-28 DIAGNOSIS — Z5321 Procedure and treatment not carried out due to patient leaving prior to being seen by health care provider: Secondary | ICD-10-CM | POA: Insufficient documentation

## 2024-02-28 DIAGNOSIS — R109 Unspecified abdominal pain: Secondary | ICD-10-CM | POA: Diagnosis present
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
## 2024-02-29 ENCOUNTER — Ambulatory Visit: Payer: MEDICAID | Admitting: Pediatrics

## 2024-02-29 VITALS — Temp 98.6°F | Wt 195.2 lb

## 2024-02-29 DIAGNOSIS — R300 Dysuria: Secondary | ICD-10-CM | POA: Diagnosis not present

## 2024-02-29 DIAGNOSIS — F419 Anxiety disorder, unspecified: Secondary | ICD-10-CM

## 2024-02-29 DIAGNOSIS — Z113 Encounter for screening for infections with a predominantly sexual mode of transmission: Secondary | ICD-10-CM

## 2024-02-29 DIAGNOSIS — Z114 Encounter for screening for human immunodeficiency virus [HIV]: Secondary | ICD-10-CM

## 2024-02-29 LAB — POCT URINALYSIS DIPSTICK
Bilirubin, UA: NEGATIVE
Blood, UA: NEGATIVE
Glucose, UA: NEGATIVE
Ketones, UA: NEGATIVE
Leukocytes, UA: NEGATIVE
Nitrite, UA: NEGATIVE
Protein, UA: POSITIVE — AB
Spec Grav, UA: 1.01 (ref 1.010–1.025)
Urobilinogen, UA: 0.2 U/dL
pH, UA: 6.5 (ref 5.0–8.0)

## 2024-02-29 LAB — POCT RAPID HIV: Rapid HIV, POC: NEGATIVE

## 2024-02-29 NOTE — Patient Instructions (Addendum)
 Stop taking seroquel  Take trazodone 50 mg at bedtime to help with sleep. You can also continue to sleeptime tea! Take fluoxetine 40 mg daily.  Take Abilify 15 mg once daily for the next week (if you can get it from the pharmacy).  To Do: Schedule an appointment to meet with Burna Mortimer 2. Find out the name of the psychiatrist that works with Burna Mortimer and if they are taking new patients with your insurance. 3. Ask your [facility] case manager about transportation options. 4. Work with grandma to put your pills in the organizer each week.
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
## 2024-03-01 LAB — C. TRACHOMATIS/N. GONORRHOEAE RNA
C. trachomatis RNA, TMA: NOT DETECTED
N. gonorrhoeae RNA, TMA: NOT DETECTED

## 2024-03-01 NOTE — Telephone Encounter (Signed)
 [name] reports that pt is doing "fine" now and that the pt is on the medications mentioned in the peds note "Abilify 15mg  daily and Prozac 40mg  daily." Reminded family that this provider is no longer pt psychiatrist. Family reports that they received recommendations for another psychiatrist from PCP and that they will f/u on this.

## 2024-03-04 ENCOUNTER — Telehealth: Payer: Self-pay | Admitting: Family

## 2024-03-04 ENCOUNTER — Encounter: Payer: Self-pay | Admitting: Family

## 2024-03-04 ENCOUNTER — Ambulatory Visit (INDEPENDENT_AMBULATORY_CARE_PROVIDER_SITE_OTHER): Payer: MEDICAID | Admitting: Family

## 2024-03-04 VITALS — BP 149/90 | HR 100 | Ht 64.17 in | Wt 191.0 lb

## 2024-03-04 DIAGNOSIS — Z3042 Encounter for surveillance of injectable contraceptive: Secondary | ICD-10-CM

## 2024-03-04 DIAGNOSIS — N946 Dysmenorrhea, unspecified: Secondary | ICD-10-CM

## 2024-03-04 MED ORDER — MEDROXYPROGESTERONE ACETATE 150 MG/ML IM SUSP
150.0000 mg | Freq: Once | INTRAMUSCULAR | Status: AC
  Administered 2024-03-04: 150 mg via INTRAMUSCULAR

## 2024-03-04 NOTE — Telephone Encounter (Signed)
 Called patient to schedule follow up appointment with Bernell List. No answer. LVM.
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
## 2024-03-05 ENCOUNTER — Ambulatory Visit (INDEPENDENT_AMBULATORY_CARE_PROVIDER_SITE_OTHER): Payer: MEDICAID | Admitting: Pediatrics

## 2024-03-05 VITALS — HR 101 | Temp 99.6°F | Wt 192.8 lb

## 2024-03-05 DIAGNOSIS — F419 Anxiety disorder, unspecified: Secondary | ICD-10-CM

## 2024-03-05 MED ORDER — HYDROXYZINE HCL 25 MG PO TABS: 50.0000 mg | ORAL_TABLET | Freq: Four times a day (QID) | ORAL | 0 refills | Status: DC | PRN
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
## 2024-03-05 NOTE — Patient Instructions (Signed)
 Continue your current medications (fluoxetine 40 once daily and trazodone 50 mg at bedtime)   Take hydroxyzine 50 mg (2 tablets) every 6 hours as needed for anxious mood.   Continue sleepytime tea Ok to try melatonin up to 10 mg taken 30-60 minutes before bedtime.  Work with [name] on setting up a calming bedtime routine to help with sleep.    Follow these instructions at home: Eating and drinking  Limit or avoid alcohol, caffeinated beverages, and products that contain nicotine and tobacco, especially close to bedtime. These can disrupt your sleep. Do not eat a large meal or eat spicy foods right before bedtime. This can lead to digestive discomfort that can make it hard for you to sleep. Sleep habits  Keep a sleep diary to help you and your health care provider figure out what could be causing your insomnia. Write down: When you sleep. When you wake up during the night. How well you sleep and how rested you feel the next day. Any side effects of medicines you are taking. What you eat and drink. Make your bedroom a dark, comfortable place where it is easy to fall asleep. Put up shades or blackout curtains to block light from outside. Use a white noise machine to block noise. Keep the temperature cool. Limit screen use before bedtime. This includes: Not watching TV. Not using your smartphone, tablet, or computer. Stick to a routine that includes going to bed and waking up at the same times every day and night. This can help you fall asleep faster. Consider making a quiet activity, such as reading, part of your nighttime routine. Try to avoid taking naps during the day so that you sleep better at night. Get out of bed if you are still awake after 15 minutes of trying to sleep. Keep the lights down, but try reading or doing a quiet activity. When you feel sleepy, go back to bed. General instructions Take over-the-counter and prescription medicines only as told by your health care  provider. Exercise regularly as told by your health care provider. However, avoid exercising in the hours right before bedtime. Use relaxation techniques to manage stress. Ask your health care provider to suggest some techniques that may work well for you. These may include: Breathing exercises. Routines to release muscle tension. Visualizing peaceful scenes. Make sure that you drive carefully. Do not drive if you feel very sleepy. Keep all follow-up visits. This is important. Contact a health care provider if: You are tired throughout the day. You have trouble in your daily routine due to sleepiness. You continue to have sleep problems, or your sleep problems get worse. Get help right away if: You have thoughts about hurting yourself or someone else. Get help right away if you feel like you may hurt yourself or others, or have thoughts about taking your own life. Go to your nearest emergency room or: Call 911. Call the National Suicide Prevention Lifeline at 870-392-6185 or 988. This is open 24 hours a day. Text the Crisis Text Line at (608)502-3778. Summary Insomnia is a sleep disorder that makes it difficult to fall asleep or stay asleep. Insomnia can be long-term (chronic) or short-term (acute). Treatment for insomnia depends on the cause. Treatment may focus on treating an underlying condition that is causing the insomnia. Keep a sleep diary to help you and your health care provider figure out what could be causing your insomnia. This information is not intended to replace advice given to you by your  health care provider. Make sure you discuss any questions you have with your health care provider. Document Revised: 11/22/2021 Document Reviewed: 11/22/2021 Elsevier Patient Education  2024 [employer].

## 2024-03-07 ENCOUNTER — Telehealth: Payer: Self-pay | Admitting: Pediatrics

## 2024-03-07 ENCOUNTER — Telehealth: Payer: Self-pay | Admitting: *Deleted

## 2024-03-07 NOTE — Telephone Encounter (Signed)
 _X__ Adult care Home Forms received via Mychart/nurse line printed off by RN __X_ Nurse portion completed _X__ Forms/notes placed in Dr Charolette Forward folder for review and signature. ___ Forms completed by Provider and placed in completed Provider folder for office leadership pick up ___Forms completed by Provider and faxed to designated location, encounter closed

## 2024-03-07 NOTE — Telephone Encounter (Signed)
 Good morning,   Patients Grandmother called in to have a FL2 form filled out. If we can get that as soon as possible because their case manager just informed them that they need it to be filled out for their Monday appointment.  Thank you,

## 2024-03-10 ENCOUNTER — Emergency Department (HOSPITAL_COMMUNITY)
Admission: EM | Admit: 2024-03-10 | Discharge: 2024-03-10 | Disposition: A | Discharge status: Home / Self Care | Payer: MEDICAID | Attending: Emergency Medicine | Admitting: Emergency Medicine

## 2024-03-10 ENCOUNTER — Encounter (HOSPITAL_COMMUNITY): Payer: Self-pay | Admitting: Emergency Medicine

## 2024-03-10 DIAGNOSIS — G4700 Insomnia, unspecified: Secondary | ICD-10-CM | POA: Insufficient documentation

## 2024-03-10 DIAGNOSIS — N3000 Acute cystitis without hematuria: Secondary | ICD-10-CM | POA: Diagnosis not present

## 2024-03-10 DIAGNOSIS — J45909 Unspecified asthma, uncomplicated: Secondary | ICD-10-CM | POA: Diagnosis not present

## 2024-03-10 DIAGNOSIS — Z91010 Allergy to peanuts: Secondary | ICD-10-CM | POA: Insufficient documentation

## 2024-03-10 DIAGNOSIS — Z79899 Other long term (current) drug therapy: Secondary | ICD-10-CM | POA: Insufficient documentation

## 2024-03-10 DIAGNOSIS — M79643 Pain in unspecified hand: Secondary | ICD-10-CM | POA: Diagnosis present

## 2024-03-10 LAB — COMPREHENSIVE METABOLIC PANEL
ALT: 29 U/L (ref 0–44)
AST: 24 U/L (ref 15–41)
Albumin: 3.6 g/dL (ref 3.5–5.0)
Alkaline Phosphatase: 67 U/L (ref 38–126)
Anion gap: 11 (ref 5–15)
BUN: 8 mg/dL (ref 6–20)
CO2: 21 mmol/L — ABNORMAL LOW (ref 22–32)
Calcium: 9.2 mg/dL (ref 8.9–10.3)
Chloride: 108 mmol/L (ref 98–111)
Creatinine, Ser: 0.91 mg/dL (ref 0.44–1.00)
GFR, Estimated: >60 mL/min (ref >60)
Glucose, Bld: 90 mg/dL (ref 70–99)
Potassium: 4.2 mmol/L (ref 3.5–5.1)
Sodium: 140 mmol/L (ref 135–145)
Total Bilirubin: 0.5 mg/dL (ref 0.0–1.2)
Total Protein: 7.2 g/dL (ref 6.5–8.1)

## 2024-03-10 LAB — CBC WITH DIFFERENTIAL/PLATELET
Abs Immature Granulocytes: 0.02 10*3/uL (ref 0.00–0.07)
Basophils Absolute: 0.1 10*3/uL (ref 0.0–0.1)
Basophils Relative: 1 %
Eosinophils Absolute: 0.2 10*3/uL (ref 0.0–0.5)
Eosinophils Relative: 2 %
HCT: 39.1 % (ref 36.0–46.0)
Hemoglobin: 12.6 g/dL (ref 12.0–15.0)
Immature Granulocytes: 0 %
Lymphocytes Relative: 39 %
Lymphs Abs: 3.5 10*3/uL (ref 0.7–4.0)
MCH: 27.2 pg (ref 26.0–34.0)
MCHC: 32.2 g/dL (ref 30.0–36.0)
MCV: 84.4 fL (ref 80.0–100.0)
Monocytes Absolute: 0.5 10*3/uL (ref 0.1–1.0)
Monocytes Relative: 5 %
Neutro Abs: 4.8 10*3/uL (ref 1.7–7.7)
Neutrophils Relative %: 53 %
Platelets: 294 10*3/uL (ref 150–400)
RBC: 4.63 MIL/uL (ref 3.87–5.11)
RDW: 15.3 % (ref 11.5–15.5)
WBC: 9.1 10*3/uL (ref 4.0–10.5)
nRBC: 0 % (ref 0.0–0.2)

## 2024-03-10 LAB — RAPID URINE DRUG SCREEN, HOSP PERFORMED
Amphetamines: NOT DETECTED
Barbiturates: NOT DETECTED
Benzodiazepines: NOT DETECTED
Cocaine: NOT DETECTED
Opiates: NOT DETECTED
Tetrahydrocannabinol: POSITIVE — AB

## 2024-03-10 LAB — URINALYSIS, ROUTINE W REFLEX MICROSCOPIC
Bacteria, UA: NONE SEEN
Bilirubin Urine: NEGATIVE
Glucose, UA: NEGATIVE mg/dL
Hgb urine dipstick: NEGATIVE
Ketones, ur: 5 mg/dL — AB
Nitrite: NEGATIVE
Protein, ur: NEGATIVE mg/dL
Specific Gravity, Urine: 1.025 (ref 1.005–1.030)
pH: 5 (ref 5.0–8.0)

## 2024-03-10 LAB — ETHANOL: Alcohol, Ethyl (B): <10 mg/dL (ref <10)

## 2024-03-10 LAB — HCG, SERUM, QUALITATIVE: Preg, Serum: NEGATIVE

## 2024-03-10 MED ORDER — CEPHALEXIN 500 MG PO CAPS
500.0000 mg | ORAL_CAPSULE | Freq: Four times a day (QID) | ORAL | 0 refills | Status: DC
Start: 2024-03-10 — End: 2024-03-14
  Filled 2024-03-10: qty 20, 5d supply, fill #0

## 2024-03-10 NOTE — Discharge Instructions (Addendum)
 Take trazodone and hydroxyzine prescribed for your sleep.  Please follow-up with them for further evaluation or [facility] evaluation.  Labs are reassuring here. I would recommend staying well hydrated and eating regular meals throughout the day. I have sent antibiotics for urinary tract infection, it is called Keflex.   Return to ER with new or worsening symptoms.

## 2024-03-10 NOTE — ED Provider Notes (Signed)
 [facility] AT [facility] Provider Note   CSN: [id number] Arrival date & time: 03/10/24  9562     History  Chief Complaint  Patient presents with   Hand Pain    [name] is a 18 y.o. female with past medical history of ADHD, major depressive disorder, anxiety, THC use, bipolar disorder, asthma presenting to emergency room with complaint of hand pain.  Patient reports that for the past 3 weeks she has had trouble sleeping.  Reports that she was sleep only a few hours each night.  Reports she has been trying trazodone and hydroxyzine.  Reports this has not been working.  Reports it is making her very anxious and agitated. Reports her family is stressing her out a lot at home. She reports she has been compliant with her medications.  Reports she did not sleep at all last night.  Complains of intermittent muscle twitching and spasm as well as left eyelid twitching that started last night. Endorses suprapubic pain and dysuria. Reports she recently switched her diet to vegetarian and now she has been eating poorly as well.   Denies any auditory or visual hallucinations.  Denies any suicidal ideation or homicidal ideation.  Patient refusing to let her great-grandmother speak or to contribute at bedside.   Hand Pain       Home Medications Prior to Admission medications   Medication Sig Start Date End Date Taking? Authorizing Provider  ARIPiprazole (ABILIFY) 15 MG tablet Take 1 tablet (15 mg total) by mouth daily. 02/23/24   Starleen Blue, NP  ARIPiprazole ER (ABILIFY MAINTENA) 400 MG PRSY prefilled syringe Inject 400 mg into the muscle every 28 (twenty-eight) days. 03/21/24   Starleen Blue, NP  Cholecalciferol (VITAMIN D3 GUMMIES PO) Take 1 tablet by mouth daily.    [provider]  FLUoxetine (PROZAC) 40 MG capsule Take 1 capsule (40 mg total) by mouth every morning. 02/22/24   Starleen Blue, NP  hydrOXYzine (ATARAX) 25 MG tablet Take 2 tablets  (50 mg total) by mouth every 6 (six) hours as needed for anxiety (up to 3 times per day). 03/05/24   Ettefagh, Aron Baba, MD  nicotine (NICODERM CQ - DOSED IN MG/24 HOURS) 21 mg/24hr patch Place 1 patch (21 mg total) onto the skin daily at 6 (six) AM. 02/23/24   Starleen Blue, NP  traZODone (DESYREL) 50 MG tablet Take 1 tablet (50 mg total) by mouth at bedtime. 02/22/24   Starleen Blue, NP      Allergies    Apple juice, Fish-derived products, Other, Peanut-containing drug products, Shellfish allergy, Banana, and Watermelon [citrullus vulgaris]    Review of Systems   Review of Systems  Physical Exam Updated Vital Signs BP 128/69 (BP Location: Right Arm)   Pulse 100   Temp 98.1 F (36.7 C)   Resp 16   SpO2 100%  Physical Exam Vitals and nursing note reviewed.  Constitutional:      General: She is not in acute distress.    Appearance: She is not toxic-appearing.  HENT:     Head: Normocephalic and atraumatic.  Eyes:     General: No scleral icterus.    Conjunctiva/sclera: Conjunctivae normal.  Cardiovascular:     Rate and Rhythm: Normal rate and regular rhythm.     Pulses: Normal pulses.     Heart sounds: Normal heart sounds.  Pulmonary:     Effort: Pulmonary effort is normal. No respiratory distress.     Breath sounds: Normal  breath sounds.  Abdominal:     General: Abdomen is flat. Bowel sounds are normal.     Palpations: Abdomen is soft.     Tenderness: There is no abdominal tenderness.  Musculoskeletal:     Right lower leg: No edema.     Left lower leg: No edema.  Skin:    General: Skin is warm and dry.     Findings: No lesion.  Neurological:     General: No focal deficit present.     Mental Status: She is alert and oriented to person, place, and time. Mental status is at baseline.  Psychiatric:        Attention and Perception: She does not perceive auditory or visual hallucinations.        Mood and Affect: Mood is anxious. Affect is tearful.        Behavior:  Behavior is not agitated.        Thought Content: Thought content is not paranoid or delusional. Thought content does not include homicidal or suicidal ideation. Thought content does not include homicidal or suicidal plan.     ED Results / Procedures / Treatments   Labs (all labs ordered are listed, but only abnormal results are displayed) Labs Reviewed  COMPREHENSIVE METABOLIC PANEL - Abnormal; Notable for the following components:      Result Value   CO2 21 (*)    All other components within normal limits  URINALYSIS, ROUTINE W REFLEX MICROSCOPIC - Abnormal; Notable for the following components:   APPearance HAZY (*)    Ketones, ur 5 (*)    Leukocytes,Ua TRACE (*)    All other components within normal limits  URINE CULTURE  ETHANOL  CBC WITH DIFFERENTIAL/PLATELET  HCG, SERUM, QUALITATIVE  RAPID URINE DRUG SCREEN, HOSP PERFORMED    EKG None  Radiology No results found.  Procedures Procedures    Medications Ordered in ED Medications - No data to display  ED Course/ Medical Decision Making/ A&P Clinical Course as of 03/10/24 0944  Wynelle Link Mar 10, 2024  0920 Patient is asleep, resting in bed. Awaiting urine and hcg.  [JB]    Clinical Course User Index [JB] Kathryn Cosby, Horald Chestnut, PA-C                                 Medical Decision Making Amount and/or Complexity of Data Reviewed Labs: ordered.  Risk Prescription drug management.   This patient presents to the ED for concern of insomnia, this involves an extensive number of treatment options, and is a complaint that carries with it a high risk of complications and morbidity.  The differential diagnosis includes psychosis, suicidal ideation, medication side effect, medication noncompliance, electrolyte abnormality, dehydration, urinary tract infection, drug abuse, anxiety, depression   Co morbidities that complicate the patient evaluation  ADHD, major depressive disorder, anxiety, THC use, bipolar disorder,  asthma   Additional history obtained:  Additional history obtained from 02/21/24 ED visit & 03/05/24 OV    Lab Tests:  I personally interpreted labs.  The pertinent results include:   Leukocytosis no anemia.  CMP without significant electrolyte abnormality elevation liver enzymes.  Normal kidney function. EtOH <10, hCG negative.  UA positive for leukocytes, will send for culture since patient is endorsing symptoms and start on Keflex.  UDS pending   Cardiac Monitoring: / EKG:  The patient was maintained on a cardiac monitor.  I personally viewed and interpreted the cardiac monitored  which showed an underlying rhythm of: sinus     Problem List / ED Course / Critical interventions / Medication management  Patient reporting with multiple complains primarily of insomnia that has been ongoing and monitored by primary care provider. She was recently started on Trazodone and Hydroxyzine. This has not been helping. Reports that she has had some muscle cramping of her calf and eyelids. She reports increased stress and anxiety. Appears to have appropriate judgement. She is not acute psychotic. No SI, HI. She has good family support at home, although she reports her family is stressing her out, as well. Will check CBC and CMP.  She will check for electrolyte abnormality or dehydration.  Will also obtain UA to evaluate for symptoms of dysuria.  Patient reports that she has been having dysuria for the past few weeks and has been evaluated by primary care for this complaint.  Recently had GNC as well as UA and wet prep which were normal.  She is hemodynamically stable and well-appearing.  She has no focal area of abdominal tenderness on exam.  She is moving extremities without any difficulty.  Denies any injury trauma or fall.  No chest pain or shortness of breath.  Lungs clear to auscultation bilaterally. Anticipate stable for discharge and follow up outpatient.  Patient hemodynamically stable and  well-appearing throughout stay.  Her lab work is reassuring. She has rested in ER while awaiting results and reports she feels better. She does have leukocytes in urine since she is endorsing symptoms will send for culture and start her on an antibiotic.  Patient encouraged to go to behavioral health urgent care if she has recurrence of anxiety or for further evaluation of insomnia.  Patient agrees and understands plan.  I have reviewed the patients home medicines and have made adjustments as needed   Plan  F/u w/ PCP in 2-3d to ensure resolution of sx.  Patient was given return precautions. Patient stable for discharge at this time.  Patient educated on sx/dx and verbalized understanding of plan. Return to ER w/ new or worsening sx.          Final Clinical Impression(s) / ED Diagnoses Final diagnoses:  Acute cystitis without hematuria  Insomnia, unspecified type    Rx / DC Orders ED Discharge Orders          Ordered    cephALEXin (KEFLEX) 500 MG capsule  4 times daily        03/10/24 1113              Haylin Camilli, Horald Chestnut, PA-C 03/10/24 1117    Rondel Baton, MD 03/10/24 1339

## 2024-03-10 NOTE — ED Triage Notes (Signed)
 Pt here from home with c/o middle finger pain on her left hand , no obvious injury or swelling noted , pt also states that she has been having trouble sleeping

## 2024-03-11 ENCOUNTER — Other Ambulatory Visit (HOSPITAL_COMMUNITY): Payer: Self-pay

## 2024-03-11 LAB — URINE CULTURE

## 2024-03-14 ENCOUNTER — Other Ambulatory Visit: Payer: Self-pay

## 2024-03-14 ENCOUNTER — Emergency Department (HOSPITAL_COMMUNITY)
Admission: EM | Admit: 2024-03-14 | Discharge: 2024-03-14 | Disposition: A | Discharge status: Home / Self Care | Payer: MEDICAID | Attending: Student | Admitting: Student

## 2024-03-14 ENCOUNTER — Encounter (HOSPITAL_COMMUNITY): Payer: Self-pay

## 2024-03-14 DIAGNOSIS — Z91010 Allergy to peanuts: Secondary | ICD-10-CM | POA: Insufficient documentation

## 2024-03-14 DIAGNOSIS — R300 Dysuria: Secondary | ICD-10-CM | POA: Diagnosis present

## 2024-03-14 DIAGNOSIS — N3000 Acute cystitis without hematuria: Secondary | ICD-10-CM | POA: Insufficient documentation

## 2024-03-14 LAB — URINALYSIS, ROUTINE W REFLEX MICROSCOPIC
Bilirubin Urine: NEGATIVE
Glucose, UA: NEGATIVE mg/dL
Hgb urine dipstick: NEGATIVE
Ketones, ur: NEGATIVE mg/dL
Leukocytes,Ua: NEGATIVE
Nitrite: POSITIVE — AB
Protein, ur: 30 mg/dL — AB
Specific Gravity, Urine: 1.016 (ref 1.005–1.030)
pH: 5 (ref 5.0–8.0)

## 2024-03-14 LAB — BASIC METABOLIC PANEL
Anion gap: 9 (ref 5–15)
BUN: 9 mg/dL (ref 6–20)
CO2: 21 mmol/L — ABNORMAL LOW (ref 22–32)
Calcium: 9.3 mg/dL (ref 8.9–10.3)
Chloride: 105 mmol/L (ref 98–111)
Creatinine, Ser: 0.82 mg/dL (ref 0.44–1.00)
GFR, Estimated: >60 mL/min (ref >60)
Glucose, Bld: 90 mg/dL (ref 70–99)
Potassium: 3.7 mmol/L (ref 3.5–5.1)
Sodium: 135 mmol/L (ref 135–145)

## 2024-03-14 LAB — CBC WITH DIFFERENTIAL/PLATELET
Abs Immature Granulocytes: 0.02 10*3/uL (ref 0.00–0.07)
Basophils Absolute: 0.1 10*3/uL (ref 0.0–0.1)
Basophils Relative: 1 %
Eosinophils Absolute: 0.1 10*3/uL (ref 0.0–0.5)
Eosinophils Relative: 1 %
HCT: 38.5 % (ref 36.0–46.0)
Hemoglobin: 11.8 g/dL — ABNORMAL LOW (ref 12.0–15.0)
Immature Granulocytes: 0 %
Lymphocytes Relative: 38 %
Lymphs Abs: 3.6 10*3/uL (ref 0.7–4.0)
MCH: 26.5 pg (ref 26.0–34.0)
MCHC: 30.6 g/dL (ref 30.0–36.0)
MCV: 86.5 fL (ref 80.0–100.0)
Monocytes Absolute: 0.5 10*3/uL (ref 0.1–1.0)
Monocytes Relative: 5 %
Neutro Abs: 5.2 10*3/uL (ref 1.7–7.7)
Neutrophils Relative %: 55 %
Platelets: 293 10*3/uL (ref 150–400)
RBC: 4.45 MIL/uL (ref 3.87–5.11)
RDW: 15.1 % (ref 11.5–15.5)
WBC: 9.5 10*3/uL (ref 4.0–10.5)
nRBC: 0 % (ref 0.0–0.2)

## 2024-03-14 LAB — PREGNANCY, URINE: Preg Test, Ur: NEGATIVE

## 2024-03-14 MED ORDER — SODIUM CHLORIDE 0.9 % IV BOLUS
500.0000 mL | Freq: Once | INTRAVENOUS | Status: AC
  Administered 2024-03-14: 500 mL via INTRAVENOUS

## 2024-03-14 MED ORDER — IBUPROFEN 200 MG PO TABS
600.0000 mg | ORAL_TABLET | Freq: Once | ORAL | Status: AC
  Administered 2024-03-14: 600 mg via ORAL
  Filled 2024-03-14: qty 3

## 2024-03-14 MED ORDER — FENTANYL CITRATE PF 50 MCG/ML IJ SOSY
50.0000 ug | PREFILLED_SYRINGE | Freq: Once | INTRAMUSCULAR | Status: AC
  Administered 2024-03-14: 50 ug via INTRAVENOUS
  Filled 2024-03-14: qty 1

## 2024-03-14 MED ORDER — CEPHALEXIN 500 MG PO CAPS
500.0000 mg | ORAL_CAPSULE | Freq: Four times a day (QID) | ORAL | 0 refills | Status: DC
Start: 2024-03-14 — End: 2024-05-15

## 2024-03-14 MED ORDER — ACETAMINOPHEN 500 MG PO TABS
1000.0000 mg | ORAL_TABLET | Freq: Once | ORAL | Status: AC
  Administered 2024-03-14: 1000 mg via ORAL
  Filled 2024-03-14: qty 2

## 2024-03-14 MED ORDER — SODIUM CHLORIDE 0.9 % IV SOLN
1.0000 g | Freq: Once | INTRAVENOUS | Status: AC
  Administered 2024-03-14: 1 g via INTRAVENOUS
  Filled 2024-03-14: qty 10

## 2024-03-14 NOTE — Discharge Instructions (Addendum)
 Take the Keflex prescribed as directed. It will work well if taken 4 times a day for the next 5 days.   A culture was sent on your urine specimen today and should be reviewed with your primary care provider in 2-3 days.
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
## 2024-03-14 NOTE — ED Provider Notes (Signed)
 [place] EMERGENCY DEPARTMENT AT [facility] Provider Note   CSN: [id number] Arrival date & time: 03/14/24  1641     History  Chief Complaint  Patient presents with   Psychiatric Evaluation   Dysuria    [name] is a 18 y.o. female.  Patient to ED with concern for persistent urinary tract infection. She reports symptoms for the past 3 weeks despite treatment with antibiotics. Not aware of any fever. She reports nausea without vomiting. No vaginal discharge. No concern for pelvic infection.   The history is provided by the patient. No language interpreter was used.  Dysuria      Home Medications Prior to Admission medications   Medication Sig Start Date End Date Taking? Authorizing Provider  ARIPiprazole (ABILIFY) 15 MG tablet Take 1 tablet (15 mg total) by mouth daily. 02/23/24   Starleen Blue, NP  ARIPiprazole ER (ABILIFY MAINTENA) 400 MG PRSY prefilled syringe Inject 400 mg into the muscle every 28 (twenty-eight) days. 03/21/24   Starleen Blue, NP  cephALEXin (KEFLEX) 500 MG capsule Take 1 capsule (500 mg total) by mouth 4 (four) times daily. 03/14/24   Elpidio Anis, PA-C  Cholecalciferol (VITAMIN D3 GUMMIES PO) Take 1 tablet by mouth daily.    [provider]  FLUoxetine (PROZAC) 40 MG capsule Take 1 capsule (40 mg total) by mouth every morning. 02/22/24   Starleen Blue, NP  hydrOXYzine (ATARAX) 25 MG tablet Take 2 tablets (50 mg total) by mouth every 6 (six) hours as needed for anxiety (up to 3 times per day). 03/05/24   Ettefagh, Aron Baba, MD  nicotine (NICODERM CQ - DOSED IN MG/24 HOURS) 21 mg/24hr patch Place 1 patch (21 mg total) onto the skin daily at 6 (six) AM. 02/23/24   Starleen Blue, NP  traZODone (DESYREL) 50 MG tablet Take 1 tablet (50 mg total) by mouth at bedtime. 02/22/24   Starleen Blue, NP      Allergies    Apple juice, Fish-derived products, Other, Peanut-containing drug products, Shellfish allergy, Banana, and Watermelon  [citrullus vulgaris]    Review of Systems   Review of Systems  Genitourinary:  Positive for dysuria.    Physical Exam Updated Vital Signs BP (!) 142/91 (BP Location: Left Arm)   Pulse 90   Temp 99.1 F (37.3 C) (Oral)   Resp 16   Ht 5\' 4"  (1.626 m)   Wt 87.5 kg   SpO2 100%   BMI 33.11 kg/m  Physical Exam Vitals and nursing note reviewed.  Constitutional:      General: She is not in acute distress.    Appearance: Normal appearance. She is well-developed. She is not ill-appearing.  Pulmonary:     Effort: Pulmonary effort is normal.  Musculoskeletal:        General: Normal range of motion.     Cervical back: Normal range of motion.  Skin:    General: Skin is warm and dry.  Neurological:     Mental Status: She is alert and oriented to person, place, and time.     ED Results / Procedures / Treatments   Labs (all labs ordered are listed, but only abnormal results are displayed) Labs Reviewed  URINALYSIS, ROUTINE W REFLEX MICROSCOPIC - Abnormal; Notable for the following components:      Result Value   Color, Urine ORANGE (*)    APPearance HAZY (*)    Protein, ur 30 (*)    Nitrite POSITIVE (*)    Bacteria, UA MANY (*)  All other components within normal limits  CBC WITH DIFFERENTIAL/PLATELET - Abnormal; Notable for the following components:   Hemoglobin 11.8 (*)    All other components within normal limits  BASIC METABOLIC PANEL - Abnormal; Notable for the following components:   CO2 21 (*)    All other components within normal limits  URINE CULTURE  PREGNANCY, URINE   Results for orders placed or performed during the hospital encounter of 03/14/24  Urinalysis, Routine w reflex microscopic -Urine, Clean Catch   Collection Time: 03/14/24  6:35 PM  Result Value Ref Range   Color, Urine ORANGE (A) YELLOW   APPearance HAZY (A) CLEAR   Specific Gravity, Urine 1.016 1.005 - 1.030   pH 5.0 5.0 - 8.0   Glucose, UA NEGATIVE NEGATIVE mg/dL   Hgb urine dipstick  NEGATIVE NEGATIVE   Bilirubin Urine NEGATIVE NEGATIVE   Ketones, ur NEGATIVE NEGATIVE mg/dL   Protein, ur 30 (A) NEGATIVE mg/dL   Nitrite POSITIVE (A) NEGATIVE   Leukocytes,Ua NEGATIVE NEGATIVE   RBC / HPF 0-5 0 - 5 RBC/hpf   WBC, UA 11-20 0 - 5 WBC/hpf   Bacteria, UA MANY (A) NONE SEEN   Squamous Epithelial / HPF 11-20 0 - 5 /HPF   Mucus PRESENT    Hyaline Casts, UA PRESENT   Pregnancy, urine   Collection Time: 03/14/24  6:35 PM  Result Value Ref Range   Preg Test, Ur NEGATIVE NEGATIVE  CBC with Differential   Collection Time: 03/14/24  8:27 PM  Result Value Ref Range   WBC 9.5 4.0 - 10.5 K/uL   RBC 4.45 3.87 - 5.11 MIL/uL   Hemoglobin 11.8 (L) 12.0 - 15.0 g/dL   HCT 16.1 09.6 - 04.5 %   MCV 86.5 80.0 - 100.0 fL   MCH 26.5 26.0 - 34.0 pg   MCHC 30.6 30.0 - 36.0 g/dL   RDW 40.9 81.1 - 91.4 %   Platelets 293 150 - 400 K/uL   nRBC 0.0 0.0 - 0.2 %   Neutrophils Relative % 55 %   Neutro Abs 5.2 1.7 - 7.7 K/uL   Lymphocytes Relative 38 %   Lymphs Abs 3.6 0.7 - 4.0 K/uL   Monocytes Relative 5 %   Monocytes Absolute 0.5 0.1 - 1.0 K/uL   Eosinophils Relative 1 %   Eosinophils Absolute 0.1 0.0 - 0.5 K/uL   Basophils Relative 1 %   Basophils Absolute 0.1 0.0 - 0.1 K/uL   Immature Granulocytes 0 %   Abs Immature Granulocytes 0.02 0.00 - 0.07 K/uL  Basic metabolic panel   Collection Time: 03/14/24  8:27 PM  Result Value Ref Range   Sodium 135 135 - 145 mmol/L   Potassium 3.7 3.5 - 5.1 mmol/L   Chloride 105 98 - 111 mmol/L   CO2 21 (L) 22 - 32 mmol/L   Glucose, Bld 90 70 - 99 mg/dL   BUN 9 6 - 20 mg/dL   Creatinine, Ser 7.82 0.44 - 1.00 mg/dL   Calcium 9.3 8.9 - 95.6 mg/dL   GFR, Estimated >21 >30 mL/min   Anion gap 9 5 - 15    EKG None  Radiology No results found.  Procedures Procedures    Medications Ordered in ED Medications  ibuprofen (ADVIL) tablet 600 mg (600 mg Oral Given 03/14/24 1926)  acetaminophen (TYLENOL) tablet 1,000 mg (1,000 mg Oral Given 03/14/24  1925)  cefTRIAXone (ROCEPHIN) 1 g in sodium chloride 0.9 % 100 mL IVPB (0 g Intravenous Stopped 03/14/24  2131)  sodium chloride 0.9 % bolus 500 mL (500 mLs Intravenous New Bag/Given 03/14/24 2145)  fentaNYL (SUBLIMAZE) injection 50 mcg (50 mcg Intravenous Given 03/14/24 2019)    ED Course/ Medical Decision Making/ A&P Clinical Course as of 03/14/24 2146  Thu Mar 14, 2024  1733 Patient arrives by [facility], called by friend for the patient. She clearly discusses her psychiatric history and that she no longer takes medications. She states she feels stable from a psychiatric standpoint and denies SI/HI, AVH. She is calm, appropriately responsive and pleasant. She discusses being afraid her phone is hacked because she sees "glitches" in the screen image and is concerned this represents that her phone is monitored. She denies needing psychiatric assistance today. Will encourage follow up with [facility] where she has received treatment in the past to discuss whether medications would offer some benefit. UA pending to evaluate for persistent UTI.  [SU]    Clinical Course User Index [SU] Elpidio Anis, PA-C                                 Medical Decision Making This patient presents to the ED for concern of dysuria, this involves an extensive number of treatment options, and is a complaint that carries with it a high risk of complications and morbidity.  The differential diagnosis includes UTI, pyelonephritis, PID,    Co morbidities that complicate the patient evaluation  Psychiatric history (patient reported she no longer takes medications, however, grandmother confirms she is on medications regularly)   Additional history obtained:  Additional history and/or information obtained from chart review, notable for EMR   Lab Tests:  I Ordered, and personally interpreted labs.  The pertinent results include:  UA - nitrite +, many bacteria, c/w UTI. No leukocytosis, hgb 11.7; no electrolyte abnormalities,  normal renal function.     Imaging Studies ordered:  I ordered imaging studies including n/a I independently visualized and interpreted imaging which showed n/a I agree with the radiologist interpretation   Cardiac Monitoring:  The patient was maintained on a cardiac monitor.  I personally viewed and interpreted the cardiac monitored which showed an underlying rhythm of: n/a   Medicines ordered and prescription drug management:  I ordered medication including fentanyl  for pain Reevaluation of the patient after these medicines showed that the patient resolved I have reviewed the patients home medicines and have made adjustments as needed   Test Considered:  N/a   Critical Interventions:  N/a   Consultations Obtained:  I requested consultation with the n/a,  and discussed lab and imaging findings as well as pertinent plan - they recommend: n/a   Problem List / ED Course:  To ED with dysuria after recent diagnosis of UTI No fever, vomiting During ED encounter, she developed severe bilateral flank pain - additional labs ordered. IV rocephin provided for UTI. Pain resolved with single dose pain medication.  VSS    Reevaluation:  After the interventions noted above, I reevaluated the patient and found that they have :resolved   Social Determinants of Health:  Psychiatric history, lives with grandmother   Disposition:  After consideration of the diagnostic results and the patients response to treatment, I feel that the patient would benefit from discharge to home.   Amount and/or Complexity of Data Reviewed Labs: ordered.  Risk OTC drugs. Prescription drug management.           Final Clinical Impression(s) /  ED Diagnoses Final diagnoses:  Acute cystitis without hematuria    Rx / DC Orders ED Discharge Orders          Ordered    cephALEXin (KEFLEX) 500 MG capsule  4 times daily        03/14/24 2144              Elpidio Anis,  PA-C 03/14/24 2146    Glendora Score, MD 03/15/24 1221

## 2024-03-15 ENCOUNTER — Emergency Department (HOSPITAL_COMMUNITY)
Admission: EM | Admit: 2024-03-15 | Discharge: 2024-03-15 | Disposition: A | Discharge status: Home / Self Care | Payer: MEDICAID | Attending: Emergency Medicine | Admitting: Emergency Medicine

## 2024-03-15 ENCOUNTER — Other Ambulatory Visit (HOSPITAL_COMMUNITY): Payer: Self-pay

## 2024-03-15 ENCOUNTER — Other Ambulatory Visit: Payer: Self-pay

## 2024-03-15 ENCOUNTER — Encounter (HOSPITAL_COMMUNITY): Payer: Self-pay

## 2024-03-15 DIAGNOSIS — R3989 Other symptoms and signs involving the genitourinary system: Secondary | ICD-10-CM | POA: Diagnosis not present

## 2024-03-15 DIAGNOSIS — Z91010 Allergy to peanuts: Secondary | ICD-10-CM | POA: Diagnosis not present

## 2024-03-15 DIAGNOSIS — R1030 Lower abdominal pain, unspecified: Secondary | ICD-10-CM | POA: Diagnosis present

## 2024-03-15 MED ORDER — PHENAZOPYRIDINE HCL 200 MG PO TABS
200.0000 mg | ORAL_TABLET | Freq: Once | ORAL | Status: AC
  Administered 2024-03-15: 200 mg via ORAL
  Filled 2024-03-15: qty 1

## 2024-03-15 MED ORDER — PHENAZOPYRIDINE HCL 200 MG PO TABS: 200.0000 mg | ORAL_TABLET | Freq: Three times a day (TID) | ORAL | 0 refills | Status: AC

## 2024-03-15 NOTE — ED Provider Notes (Signed)
 [place] EMERGENCY DEPARTMENT AT [facility] Provider Note   CSN: [id number] Arrival date & time: 03/15/24  4098     History  Chief Complaint  Patient presents with   Abdominal Pain    [name] is a 18 y.o. female, history of cluster B personality disorder, who presents to the ED secondary to not feeling better after getting a dose of ceftriaxone yesterday.  States she was diagnosed with a UTI, given ceftriaxone yesterday afternoon, and does not feel better.  States that she still has bladder spasms, and it hurts to pee.  Denies any fevers, chills.  Is not having any intractable nausea, vomiting.  Has not taken anything for the pain.  Has not started her oral antibiotics yet.  Home Medications Prior to Admission medications   Medication Sig Start Date End Date Taking? Authorizing Provider  phenazopyridine (PYRIDIUM) 200 MG tablet Take 1 tablet (200 mg total) by mouth 3 (three) times daily. 03/15/24  Yes Yardley Beltran L, PA  ARIPiprazole (ABILIFY) 15 MG tablet Take 1 tablet (15 mg total) by mouth daily. 02/23/24   Starleen Blue, NP  ARIPiprazole ER (ABILIFY MAINTENA) 400 MG PRSY prefilled syringe Inject 400 mg into the muscle every 28 (twenty-eight) days. 03/21/24   Starleen Blue, NP  cephALEXin (KEFLEX) 500 MG capsule Take 1 capsule (500 mg total) by mouth 4 (four) times daily. 03/14/24   Elpidio Anis, PA-C  Cholecalciferol (VITAMIN D3 GUMMIES PO) Take 1 tablet by mouth daily.    [provider]  FLUoxetine (PROZAC) 40 MG capsule Take 1 capsule (40 mg total) by mouth every morning. 02/22/24   Starleen Blue, NP  hydrOXYzine (ATARAX) 25 MG tablet Take 2 tablets (50 mg total) by mouth every 6 (six) hours as needed for anxiety (up to 3 times per day). 03/05/24   Ettefagh, Aron Baba, MD  nicotine (NICODERM CQ - DOSED IN MG/24 HOURS) 21 mg/24hr patch Place 1 patch (21 mg total) onto the skin daily at 6 (six) AM. 02/23/24   Starleen Blue, NP  traZODone (DESYREL) 50 MG  tablet Take 1 tablet (50 mg total) by mouth at bedtime. 02/22/24   Starleen Blue, NP      Allergies    Apple juice, Fish-derived products, Other, Peanut-containing drug products, Shellfish allergy, Banana, and Watermelon [citrullus vulgaris]    Review of Systems   Review of Systems  Constitutional:  Negative for fever.  Genitourinary:  Positive for dysuria.    Physical Exam Updated Vital Signs BP (!) 147/89 (BP Location: Left Arm)   Pulse 85   Temp 98 F (36.7 C) (Oral)   Resp 16   Ht 5\' 4"  (1.626 m)   Wt 87.5 kg   SpO2 98%   BMI 33.11 kg/m  Physical Exam Vitals and nursing note reviewed.  Constitutional:      General: She is not in acute distress.    Appearance: She is well-developed.  HENT:     Head: Normocephalic and atraumatic.  Eyes:     Conjunctiva/sclera: Conjunctivae normal.  Cardiovascular:     Rate and Rhythm: Normal rate and regular rhythm.     Heart sounds: No murmur heard. Pulmonary:     Effort: Pulmonary effort is normal. No respiratory distress.     Breath sounds: Normal breath sounds.  Abdominal:     Palpations: Abdomen is soft.     Tenderness: There is abdominal tenderness in the suprapubic area.  Musculoskeletal:        General: No  swelling.     Cervical back: Neck supple.  Skin:    General: Skin is warm and dry.     Capillary Refill: Capillary refill takes less than 2 seconds.  Neurological:     Mental Status: She is alert.  Psychiatric:        Mood and Affect: Mood normal.     ED Results / Procedures / Treatments   Labs (all labs ordered are listed, but only abnormal results are displayed) Labs Reviewed - No data to display  EKG None  Radiology No results found.  Procedures Procedures    Medications Ordered in ED Medications  phenazopyridine (PYRIDIUM) tablet 200 mg (has no administration in time range)    ED Course/ Medical Decision Making/ A&P                                 Medical Decision Making Patient is an  18 year old female, diagnosed with the UTI, yesterday, at around 7 PM, received 1 dose of ceftriaxone, discharged on Keflex.  Here for continued bladder pain.  She is overall well-appearing, has not taken any of her oral antibiotics yet, and has been less than 24 hours, since taking the meds.  She states she feels no different than yesterday, I discussed that it takes 24 to 48 hours, for the antibiotics to start improving, symptoms, and we will have her follow-up with her PCP.  I prescribed her some Pyridium, for the pain.  Encouraged her to follow-up with her PCP, and instructed on return precautions  Risk Prescription drug management.     Final Clinical Impression(s) / ED Diagnoses Final diagnoses:  Bladder pain    Rx / DC Orders ED Discharge Orders          Ordered    phenazopyridine (PYRIDIUM) 200 MG tablet  3 times daily        03/15/24 0938              Armie Moren, Harley Alto, PA 03/15/24 0940    Gwyneth Sprout, MD 03/16/24 1745

## 2024-03-15 NOTE — Discharge Instructions (Signed)
 It takes 24 to 48 hours, for urinary tract infection, to start to improve.  Please take the Azo tablets, as prescribed, to help with the pain.  You can also cannot take Tylenol and ibuprofen to help with the pain, and use a heating pad.  Return to ER if you have fevers, chills, intractable nausea, vomiting, or worsening pain.

## 2024-03-15 NOTE — ED Triage Notes (Signed)
 Patient presents to ER with UTI, patient states "I was here yesterday for my UTI they gave me fentanyl for the pain and it hurts when I stand, hurts when I set, it just hurts." Patient has bottle of antibiotics with her that she states she was given yesterday.

## 2024-03-16 ENCOUNTER — Emergency Department (HOSPITAL_COMMUNITY)
Admission: EM | Admit: 2024-03-16 | Discharge: 2024-03-18 | Disposition: A | Discharge status: Other Acute Inpatient Hospital | Payer: MEDICAID | Attending: Emergency Medicine | Admitting: Emergency Medicine

## 2024-03-16 DIAGNOSIS — F1220 Cannabis dependence, uncomplicated: Secondary | ICD-10-CM | POA: Insufficient documentation

## 2024-03-16 DIAGNOSIS — F3163 Bipolar disorder, current episode mixed, severe, without psychotic features: Secondary | ICD-10-CM | POA: Diagnosis not present

## 2024-03-16 DIAGNOSIS — F3130 Bipolar disorder, current episode depressed, mild or moderate severity, unspecified: Secondary | ICD-10-CM | POA: Insufficient documentation

## 2024-03-16 DIAGNOSIS — F22 Delusional disorders: Secondary | ICD-10-CM

## 2024-03-16 DIAGNOSIS — Z91010 Allergy to peanuts: Secondary | ICD-10-CM | POA: Diagnosis not present

## 2024-03-16 LAB — URINALYSIS, W/ REFLEX TO CULTURE (INFECTION SUSPECTED)
Bilirubin Urine: NEGATIVE
Glucose, UA: NEGATIVE mg/dL
Hgb urine dipstick: NEGATIVE
Ketones, ur: NEGATIVE mg/dL
Leukocytes,Ua: NEGATIVE
Nitrite: POSITIVE — AB
Protein, ur: 30 mg/dL — AB
Specific Gravity, Urine: 1.027 (ref 1.005–1.030)
pH: 5 (ref 5.0–8.0)

## 2024-03-16 LAB — COMPREHENSIVE METABOLIC PANEL
ALT: 24 U/L (ref 0–44)
AST: 25 U/L (ref 15–41)
Albumin: 4 g/dL (ref 3.5–5.0)
Alkaline Phosphatase: 67 U/L (ref 38–126)
Anion gap: 9 (ref 5–15)
BUN: 13 mg/dL (ref 6–20)
CO2: 19 mmol/L — ABNORMAL LOW (ref 22–32)
Calcium: 9.5 mg/dL (ref 8.9–10.3)
Chloride: 110 mmol/L (ref 98–111)
Creatinine, Ser: 0.93 mg/dL (ref 0.44–1.00)
GFR, Estimated: >60 mL/min (ref >60)
Glucose, Bld: 110 mg/dL — ABNORMAL HIGH (ref 70–99)
Potassium: 4.1 mmol/L (ref 3.5–5.1)
Sodium: 138 mmol/L (ref 135–145)
Total Bilirubin: 0.5 mg/dL (ref 0.0–1.2)
Total Protein: 7.8 g/dL (ref 6.5–8.1)

## 2024-03-16 LAB — CBC
HCT: 37.8 % (ref 36.0–46.0)
Hemoglobin: 11.9 g/dL — ABNORMAL LOW (ref 12.0–15.0)
MCH: 26.8 pg (ref 26.0–34.0)
MCHC: 31.5 g/dL (ref 30.0–36.0)
MCV: 85.1 fL (ref 80.0–100.0)
Platelets: 294 10*3/uL (ref 150–400)
RBC: 4.44 MIL/uL (ref 3.87–5.11)
RDW: 15.2 % (ref 11.5–15.5)
WBC: 8.2 10*3/uL (ref 4.0–10.5)
nRBC: 0 % (ref 0.0–0.2)

## 2024-03-16 LAB — RAPID URINE DRUG SCREEN, HOSP PERFORMED
Amphetamines: NOT DETECTED
Barbiturates: NOT DETECTED
Benzodiazepines: NOT DETECTED
Cocaine: NOT DETECTED
Opiates: NOT DETECTED
Tetrahydrocannabinol: POSITIVE — AB

## 2024-03-16 LAB — URINE CULTURE

## 2024-03-16 LAB — HCG, SERUM, QUALITATIVE: Preg, Serum: NEGATIVE

## 2024-03-16 LAB — ETHANOL: Alcohol, Ethyl (B): <10 mg/dL (ref <10)

## 2024-03-16 MED ORDER — FLUOXETINE HCL 20 MG PO CAPS
40.0000 mg | ORAL_CAPSULE | Freq: Every day | ORAL | Status: DC
  Administered 2024-03-17 – 2024-03-18 (×2): 40 mg via ORAL
  Filled 2024-03-16 (×2): qty 2

## 2024-03-16 MED ORDER — PHENAZOPYRIDINE HCL 200 MG PO TABS
200.0000 mg | ORAL_TABLET | Freq: Three times a day (TID) | ORAL | Status: DC
Start: 2024-03-16 — End: 2024-03-19
  Administered 2024-03-16 – 2024-03-18 (×5): 200 mg via ORAL
  Filled 2024-03-16 (×6): qty 1

## 2024-03-16 MED ORDER — NICOTINE 21 MG/24HR TD PT24
21.0000 mg | MEDICATED_PATCH | Freq: Every day | TRANSDERMAL | Status: DC
Start: 2024-03-17 — End: 2024-03-18
  Administered 2024-03-17 – 2024-03-18 (×2): 21 mg via TRANSDERMAL
  Filled 2024-03-16 (×2): qty 1

## 2024-03-16 MED ORDER — ARIPIPRAZOLE 5 MG PO TABS
15.0000 mg | ORAL_TABLET | Freq: Every day | ORAL | Status: DC
  Administered 2024-03-16: 15 mg via ORAL
  Filled 2024-03-16: qty 3
  Filled 2024-03-16: qty 1
  Filled 2024-03-16: qty 3

## 2024-03-16 MED ORDER — PHENAZOPYRIDINE HCL 200 MG PO TABS
200.0000 mg | ORAL_TABLET | Freq: Three times a day (TID) | ORAL | Status: DC
  Administered 2024-03-16: 200 mg via ORAL
  Filled 2024-03-16: qty 1

## 2024-03-16 MED ORDER — CEPHALEXIN 500 MG PO CAPS
500.0000 mg | ORAL_CAPSULE | Freq: Four times a day (QID) | ORAL | Status: DC
  Administered 2024-03-16 – 2024-03-18 (×7): 500 mg via ORAL
  Filled 2024-03-16 (×8): qty 1

## 2024-03-16 MED ORDER — HYDROXYZINE HCL 25 MG PO TABS
50.0000 mg | ORAL_TABLET | Freq: Four times a day (QID) | ORAL | Status: DC | PRN
  Administered 2024-03-16 – 2024-03-18 (×4): 50 mg via ORAL
  Filled 2024-03-16 (×5): qty 2

## 2024-03-16 MED ORDER — TRAZODONE HCL 50 MG PO TABS
50.0000 mg | ORAL_TABLET | Freq: Every day | ORAL | Status: DC
  Administered 2024-03-16 – 2024-03-17 (×2): 50 mg via ORAL
  Filled 2024-03-16 (×2): qty 1
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
## 2024-03-16 NOTE — ED Notes (Signed)
 This nurse received pt from hall d, pt is pleasant and resting in bed. Bed set to lowest setting, call bell at reach of pt.
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
## 2024-03-16 NOTE — ED Notes (Signed)
 Pt belongings are in triage cabinet items include black book bag, blue purse, and 2 pt belonging bags.

## 2024-03-17 ENCOUNTER — Encounter (HOSPITAL_COMMUNITY): Payer: Self-pay

## 2024-03-17 LAB — RESP PANEL BY RT-PCR (RSV, FLU A&B, COVID)  RVPGX2
Influenza A by PCR: NEGATIVE
Influenza B by PCR: NEGATIVE
Resp Syncytial Virus by PCR: NEGATIVE
SARS Coronavirus 2 by RT PCR: NEGATIVE

## 2024-03-17 MED ORDER — ACETAMINOPHEN 325 MG PO TABS
650.0000 mg | ORAL_TABLET | Freq: Four times a day (QID) | ORAL | Status: DC | PRN
  Administered 2024-03-17 – 2024-03-18 (×2): 650 mg via ORAL
  Filled 2024-03-17 (×2): qty 2

## 2024-03-17 MED ORDER — ACETAMINOPHEN 325 MG PO TABS
650.0000 mg | ORAL_TABLET | Freq: Once | ORAL | Status: AC
  Administered 2024-03-17: 650 mg via ORAL
  Filled 2024-03-17: qty 2

## 2024-03-17 MED ORDER — LORAZEPAM 0.5 MG PO TABS
0.5000 mg | ORAL_TABLET | Freq: Three times a day (TID) | ORAL | Status: DC | PRN
  Administered 2024-03-17 – 2024-03-18 (×2): 0.5 mg via ORAL
  Filled 2024-03-17 (×2): qty 1
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
## 2024-03-17 NOTE — ED Notes (Signed)
 Pt has Abilify ordered but states she had a reaction to it last time,  It is listed as an intolerance in her allergies, please advise.

## 2024-03-17 NOTE — Progress Notes (Signed)
 BHH/BMU LCSW Progress Note   03/17/2024    1:16 PM  [name]   [id number]   Type of Contact and Topic:  Psychiatric Bed Placement   Pt accepted to [facility] [address]     Patient meets inpatient criteria per Dahlia Byes, NP  The attending provider will be Dr. Sherrian Divers  Call report to [phone]  Wadie Lessen, RN @ [facility] notified.     Pt scheduled  to arrive at [facility] for TOMORROW after 0900.    Damita Dunnings, MSW, LCSW-A  1:17 PM 03/17/2024

## 2024-03-17 NOTE — ED Notes (Addendum)
 Spoke to [place] at [facility], requested COVID test, gave report. Per Karie Soda patient can arrive after 9am tomorrow, [facility] [address]W   Receiving MD, Dr Roselyn Reef

## 2024-03-17 NOTE — ED Notes (Signed)
 Pt states having pain in left hand 5/10  EDP notified.  Also states vegetarian diet only.

## 2024-03-17 NOTE — ED Provider Notes (Signed)
 Emergency Medicine Observation Re-evaluation Note  [name] is a 18 y.o. female, seen on rounds today.  Pt initially presented to the ED for complaints of Psychiatric Evaluation Currently, the patient is IVC and awaiting inpatient psychiatric placement.  Patient with a history of bipolar and posttraumatic stress disorder.  Physical Exam  BP (!) 148/75 (BP Location: Right Arm)   Pulse 81   Temp 97.6 F (36.4 C) (Oral)   Resp 18   Ht 1.651 m (5\' 5" )   Wt 87.1 kg   SpO2 98%   BMI 31.95 kg/m  Physical Exam General: Nontoxic no acute distress resting comfortably Cardiac:  Lungs: No respiratory distress Psych: Baseline.  ED Course / MDM  EKG:   I have reviewed the labs performed to date as well as medications administered while in observation.  Recent changes in the last 24 hours include no difficulties overnight.  Patient is IVC.  Plan  [facility] care they are looking for placement.  Patient is IVC.    Vanetta Mulders, MD 03/17/24 [phone]

## 2024-03-17 NOTE — ED Notes (Addendum)
 Pt is increasingly restless and pacing.  States she must clean the germs.  NP notified of patient's decline in emotional state.

## 2024-03-17 NOTE — ED Notes (Addendum)
 RN communicated to pt that NP will stop in to evaluate pt.

## 2024-03-17 NOTE — ED Notes (Signed)
 Escorted pt to shower, provided towels, soap, toothpaste and toothbrush

## 2024-03-17 NOTE — ED Notes (Signed)
 Pt to nurse station several times asking about pain medications for side and back d/t uti. Medication order received from provider. Pt very anxious, offered prn meds which were accepted and spoke to pt reassuringly for a few minutes. Pt relaxed and resting in bed. Provided cranberry juice per request

## 2024-03-17 NOTE — ED Notes (Signed)
 Pt using 1 of 3 calls

## 2024-03-17 NOTE — ED Notes (Signed)
 Pt requested cleaning supplies to remove germs from room. RN explained to patient that EVS cleans and can not provide her material requested.

## 2024-03-17 NOTE — ED Notes (Addendum)
 [name]

## 2024-03-17 NOTE — Progress Notes (Signed)
 Patient has been denied by [facility] due to no appropriate beds available. Patient meets BH inpatient criteria per Dahlia Byes, NP. Patient has been faxed out to the following facilities:   [facility] [address] [place]., [place] [address] [phone]  [facility] [address], [place] [address] [phone]  [facility] [address] [place], [name] [address] [phone]  [facility] Patient Placement [facility], [place] [phone]  [facility] [address] [place] [address] [phone]  [facility] [address] [place] [address] [phone]  [facility] Regino Bellow [place] [address] [phone]  [facility] [address] [place] [address] [phone]  [facility] [address] [place] [address] [phone]  [facility] EFAX [address] [place] [phone]  [facility] [address] [place] [address] [phone]  [facility] [address] [place] [address] [phone]  [facility] [address] [place], [place] [address] [phone]  [facility] [address] [place] [address] [phone]  [facility] [address] [place] [address] [phone]  [facility] [address] [place] [address] [phone]  [facility] [address] [place] [address] [phone]  [facility] [address], [place]  [address] [phone]   Damita Dunnings, MSW, LCSW-A  12:36 PM 03/17/2024

## 2024-03-17 NOTE — Progress Notes (Signed)
 [facility]U LCSW Progress Note   03/17/2024    5:14 PM  [name]   [id number]   Type of Contact and Topic:  Psychiatric Bed Placement   Pt [facility]     Patient meets inpatient criteria per Dahlia Byes, NP  The attending provider will be Dr. Roselyn Reef   Call report to [phone]  Wadie Lessen, RN @ [facility] ED notified.     Pt scheduled  to arrive at [facility] AFTER 0900.    Damita Dunnings, MSW, LCSW-A  5:15 PM 03/17/2024

## 2024-03-17 NOTE — ED Notes (Signed)
 PT pacing in room, stating she is unable to urinate and was displaying increasing signs of anxiety and tearful.  This RN was able to verbally console pt.  EDP notified

## 2024-03-17 NOTE — ED Notes (Signed)
 Spoke with [name] at [facility] - they are offering placement for this patient and will connect with SW to set up

## 2024-03-17 NOTE — ED Notes (Signed)
 Pt states she thinks she has a concussion, this RN listened to pt concerns and informed pt that we are making sure she is safe and we will address any medical issues if they arise.

## 2024-03-18 NOTE — ED Notes (Signed)
 [facility] called at this time, and advised they "had quite a few transfers today" and they would call before their arrival.

## 2024-03-26 ENCOUNTER — Telehealth: Payer: Self-pay

## 2024-03-26 NOTE — Telephone Encounter (Signed)
 Grandmother [name] called to schedule an appointment with Dr. [place] Fuse. Patient currently at [facility] health inpatient and will be discharged today or tomorrow per parent. Debbe Odea is requesting a MRI/head scan, states [name] has been in excruciating pain and complains of pain to her leg and spine. States her  mom passed away with neuromyelitis optica. She says her mom had similar symptoms. She states she was told it was not hereditary but she is showing signs of it and it is concerning. She does state, that patient has started a new medication and she is more calm. Appointment made for 4/8, for 30 minutes. Grandparent requesting this appt to follow up and discuss her pain.

## 2024-03-27 ENCOUNTER — Telehealth: Payer: Self-pay | Admitting: Pediatrics

## 2024-03-27 NOTE — Telephone Encounter (Signed)
 Called main number on file to rs 4/8 appt provider will not be in office and appt was made incorrectly  ( needs to be made as a same day ) please advise parent or pt to call back the day of that she wants to come in

## 2024-04-02 ENCOUNTER — Ambulatory Visit (INDEPENDENT_AMBULATORY_CARE_PROVIDER_SITE_OTHER): Payer: MEDICAID | Admitting: Pediatrics

## 2024-04-02 ENCOUNTER — Ambulatory Visit: Payer: Self-pay | Admitting: Pediatrics

## 2024-04-02 VITALS — Wt 178.6 lb

## 2024-04-02 DIAGNOSIS — H6123 Impacted cerumen, bilateral: Secondary | ICD-10-CM

## 2024-04-02 DIAGNOSIS — E162 Hypoglycemia, unspecified: Secondary | ICD-10-CM | POA: Diagnosis not present

## 2024-04-02 DIAGNOSIS — R300 Dysuria: Secondary | ICD-10-CM | POA: Diagnosis not present

## 2024-04-02 DIAGNOSIS — R42 Dizziness and giddiness: Secondary | ICD-10-CM | POA: Diagnosis not present

## 2024-04-02 DIAGNOSIS — R519 Headache, unspecified: Secondary | ICD-10-CM | POA: Diagnosis not present

## 2024-04-02 DIAGNOSIS — R52 Pain, unspecified: Secondary | ICD-10-CM

## 2024-04-02 DIAGNOSIS — H6121 Impacted cerumen, right ear: Secondary | ICD-10-CM

## 2024-04-02 DIAGNOSIS — Z1329 Encounter for screening for other suspected endocrine disorder: Secondary | ICD-10-CM

## 2024-04-02 LAB — POCT GLUCOSE (DEVICE FOR HOME USE): POC Glucose: 132 mg/dL — AB (ref 70–99)

## 2024-04-02 MED ORDER — CARBAMIDE PEROXIDE 6.5 % OT SOLN
5.0000 [drp] | Freq: Once | OTIC | Status: AC
  Administered 2024-04-02: 5 [drp] via OTIC

## 2024-04-02 MED ORDER — CARBAMIDE PEROXIDE 6.5 % OT SOLN: 5.0000 [drp] | Freq: Once | OTIC | Status: DC
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
## 2024-04-03 ENCOUNTER — Emergency Department (HOSPITAL_COMMUNITY)
Admission: EM | Admit: 2024-04-03 | Discharge: 2024-04-03 | Discharge status: Against Medical Advice | Payer: MEDICAID | Attending: Emergency Medicine | Admitting: Emergency Medicine

## 2024-04-03 ENCOUNTER — Encounter (HOSPITAL_COMMUNITY): Payer: Self-pay | Admitting: *Deleted

## 2024-04-03 ENCOUNTER — Other Ambulatory Visit: Payer: Self-pay

## 2024-04-03 DIAGNOSIS — G8929 Other chronic pain: Secondary | ICD-10-CM | POA: Insufficient documentation

## 2024-04-03 DIAGNOSIS — Z5321 Procedure and treatment not carried out due to patient leaving prior to being seen by health care provider: Secondary | ICD-10-CM | POA: Insufficient documentation

## 2024-04-03 NOTE — ED Triage Notes (Signed)
 Pt is here with her great grandmother for severe chronic pain.  Pt has hx of anxiety and bipolar and is taking medications as prescribed.  She states that she has developed this severe pain in her legs and back and feet over the past month and she has began having difficulties with walking for the past week and a half.  Pt has been seen by her PCP for this yesterday and evaluated and was referred to GYN and neurology for these symptoms.

## 2024-04-03 NOTE — ED Notes (Signed)
 Pt not in triage room, waiting room, outside, or restroom. OTF

## 2024-04-15 ENCOUNTER — Ambulatory Visit (INDEPENDENT_AMBULATORY_CARE_PROVIDER_SITE_OTHER): Payer: MEDICAID | Admitting: Family

## 2024-04-15 ENCOUNTER — Encounter: Payer: Self-pay | Admitting: Family

## 2024-04-15 VITALS — BP 141/82 | HR 111 | Ht 64.57 in | Wt 179.8 lb

## 2024-04-15 DIAGNOSIS — K5900 Constipation, unspecified: Secondary | ICD-10-CM | POA: Diagnosis not present

## 2024-04-15 DIAGNOSIS — R42 Dizziness and giddiness: Secondary | ICD-10-CM

## 2024-04-15 DIAGNOSIS — Z1389 Encounter for screening for other disorder: Secondary | ICD-10-CM

## 2024-04-15 DIAGNOSIS — N898 Other specified noninflammatory disorders of vagina: Secondary | ICD-10-CM

## 2024-04-15 DIAGNOSIS — R52 Pain, unspecified: Secondary | ICD-10-CM | POA: Diagnosis not present

## 2024-04-15 LAB — POCT URINALYSIS DIPSTICK
Bilirubin, UA: NEGATIVE
Blood, UA: NEGATIVE
Glucose, UA: NEGATIVE
Ketones, UA: NEGATIVE
Leukocytes, UA: NEGATIVE
Nitrite, UA: NEGATIVE
Protein, UA: POSITIVE — AB
Spec Grav, UA: 1.02 (ref 1.010–1.025)
Urobilinogen, UA: 1 U/dL
pH, UA: 7 (ref 5.0–8.0)

## 2024-04-15 MED ORDER — POLYETHYLENE GLYCOL 3350 17 GM/SCOOP PO POWD: ORAL | 0 refills | Status: AC
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
## 2024-04-15 NOTE — Patient Instructions (Signed)
 You are constipated and need help to clean out the large amount of stool (poop) in the intestine. This guide tells you what medicine to use.  What do I need to know before starting the clean out?  It will take about 4 to 6 hours to take the medicine.  After taking the medicine, you should have a large stool within 24 hours.  Plan to stay close to a bathroom until the stool has passed. After the intestine is cleaned out, you will need to take a daily medicine.   Remember:  Constipation can last a long time. It may take 6 to 12 months for you to get back to regular bowel movements (BMs). Be patient. Things will get better slowly over time.  If you have questions, call your doctor at this number:     [phone]   When should you start the clean out?  Start the home clean out on a Friday afternoon or some other time when you will be home (and not at school).  Start between 2:00 and 4:00 in the afternoon.  You should have almost clear liquid stools by the end of the next day. If the medicine does not work or you don't know if it worked, Physicist, medical or nurse.  What medicine do I need to take?  You need to take Miralax , a powder that you mix in a clear liquid.  Follow these steps: ?    Stir the Miralax  powder into water, juice, or Gatorade. Your Miralax  dose is: 8 capfuls of Miralax  powder in 32 ounces of liquid ?    Drink 4 to 8 ounces every 30 minutes. It will take 4 to 6 hours to finish the medicine. ?    After the medicine is gone, drink more water or juice. This will help with the cleanout.   -     If the medicine gives you an upset stomach, slow down or stop.   Does I need to keep taking medicine?                                                                                                      After the clean out, you will take a daily (maintenance) medicine for at least 6 months. Your Miralax  dose is:      1-2  capful of powder in 8 ounces of liquid every day  (depending on your stool frequency after the clean-out)    You should go to the doctor for follow-up appointments as directed.  What if I get constipated again?  Some people need to have the clean out more than one time for the problem to go away. Contact your doctor to ask if you should repeat the clean out. It is OK to do it again, but you should wait at least a week before repeating the clean out.    Will I have any problems with the medicine?   You may have stomach pain or cramping during the clean out. This might mean you have to go to the  bathroom.   Take some time to sit on the toilet. The pain will go away when the stool is gone. You may want to read while you wait. A warm bath may also help.   What should I eat and drink?  Drink lots of water and juice. Fruits and vegetables are good foods to eat. Try to avoid greasy and fatty foods.

## 2024-04-16 LAB — C. TRACHOMATIS/N. GONORRHOEAE RNA
C. trachomatis RNA, TMA: NOT DETECTED
N. gonorrhoeae RNA, TMA: NOT DETECTED

## 2024-04-17 LAB — WET PREP BY MOLECULAR PROBE
Candida species: NOT DETECTED
Gardnerella vaginalis: NOT DETECTED
MICRO NUMBER:: 16354458
SPECIMEN QUALITY:: ADEQUATE
Trichomonas vaginosis: NOT DETECTED

## 2024-04-25 ENCOUNTER — Encounter: Payer: Self-pay | Admitting: Neurology

## 2024-04-29 ENCOUNTER — Ambulatory Visit: Payer: MEDICAID | Admitting: Pediatrics

## 2024-05-15 ENCOUNTER — Ambulatory Visit (INDEPENDENT_AMBULATORY_CARE_PROVIDER_SITE_OTHER): Payer: MEDICAID | Admitting: Pediatrics

## 2024-05-15 VITALS — BP 138/80 | HR 87 | Ht 64.57 in | Wt 196.2 lb

## 2024-05-15 DIAGNOSIS — Z23 Encounter for immunization: Secondary | ICD-10-CM

## 2024-05-15 DIAGNOSIS — Z1331 Encounter for screening for depression: Secondary | ICD-10-CM

## 2024-05-15 DIAGNOSIS — F419 Anxiety disorder, unspecified: Secondary | ICD-10-CM

## 2024-05-15 DIAGNOSIS — Z113 Encounter for screening for infections with a predominantly sexual mode of transmission: Secondary | ICD-10-CM

## 2024-05-15 DIAGNOSIS — R030 Elevated blood-pressure reading, without diagnosis of hypertension: Secondary | ICD-10-CM | POA: Diagnosis not present

## 2024-05-15 DIAGNOSIS — Z114 Encounter for screening for human immunodeficiency virus [HIV]: Secondary | ICD-10-CM | POA: Diagnosis not present

## 2024-05-15 DIAGNOSIS — Z1339 Encounter for screening examination for other mental health and behavioral disorders: Secondary | ICD-10-CM

## 2024-05-15 DIAGNOSIS — Z0001 Encounter for general adult medical examination with abnormal findings: Secondary | ICD-10-CM | POA: Diagnosis not present

## 2024-05-15 LAB — POCT RAPID HIV: Rapid HIV, POC: NEGATIVE

## 2024-05-15 MED ORDER — HYDROXYZINE HCL 25 MG PO TABS: 50.0000 mg | ORAL_TABLET | Freq: Four times a day (QID) | ORAL | 0 refills | Status: AC | PRN

## 2024-05-15 NOTE — Progress Notes (Signed)
 Adolescent Well Care Visit [name] is a 18 y.o. female who is here for well care.     PCP:  Canda Cera, MD   History was provided by the patient and grandmother.  Confidentiality was discussed with the patient and, if applicable, with caregiver.   Current Issues: Current concerns include  Lots of concerns. Today we limited our discussion to 2 issues.  Headaches: feels that sun hits front of head and she gets a headache. Not sleeping at all. Lost Rx for atarax  (it was at [employer] and then not sure where it went). Feels that she's drinking a lot. High stress levels currently.   [name] does not want to live with grandma or nana. She wants to live with herself. Working with the [employer] Production designer, theatre/television/film to come up with a plan now that she is 17.  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 Now she is stable on depakote. Discussed that I would not be discussing her further psych diagnoses since she has a dedicated MD. Does feel that her anxiety is not well controlled.   Nutrition: Nutrition/Eating Behaviors: wide variety  Exercise/ Media: Play any Sports?:  none Exercise:  will go outside Screen Time:  > 2 hours-counseling provided  Sleep:  Sleep: very difficult. Once she gets to sleep she sleeps great but getting to sleep is difficult. She likes music and that helps. Some guided meditation also helps. Atarax  worked well for her in the past.   Social Screening: Lives with:  between Nana and her husband And  Grandma and Shaena' brother and uncle Parental relations:  poor  Concerns regarding behavior with peers?  Does not have many in person peers. On the web a lot   Education: Not in school   Patient has a dental home: yes   Confidential social history: Did not discuss  Screenings:  The patient completed the Rapid Assessment for Adolescent Preventive Services screening questionnaire and the  following topics were identified as risk factors and discussed: healthy eating, drug use, suicidality/self harm, and mental health issues  In addition, the following topics were discussed as part of anticipatory guidance: pregnancy prevention, depression/anxiety.  PHQ-9 completed and results indicated     05/15/2024   10:01 AM 01/29/2024    4:00 PM 01/26/2024    1:35 PM  PHQ9 SCORE ONLY  PHQ-9 Total Score 2       Information is confidential and restricted. Go to Review Flowsheets to unlock data.     Physical Exam:  Vitals:   05/15/24 0948  BP: 138/80  Pulse: 87  SpO2: 99%  Weight: 196 lb 3.2 oz (89 kg)  Height: 5' 4.57" (1.64 m)   BP 138/80 (BP Location: Right Arm, Patient Position: Sitting, Cuff Size: Normal)   Pulse 87   Ht 5' 4.57" (1.64 m)   Wt 196 lb 3.2 oz (89 kg)   SpO2 99%   BMI 33.09 kg/m  Body mass index: body mass index is 33.09 kg/m. Blood pressure %iles are not available for patients who are 18 years or older.  Hearing Screening   500Hz  1000Hz  2000Hz  4000Hz   Right ear 20 20 20 20   Left ear 30 20 20 20    Vision Screening   Right eye Left eye Both eyes  Without correction     With correction 20/25 20/16 20/20     General: well developed walks with cane HEENT: PERRL Neck: supple, no  lymphadenopathy CV: RRR no murmur noted PULM: normal aeration throughout all lung fields, no crackles or wheezes Abdomen: soft, non-tender; no masses or HSM Extremities: warm and well perfused Skin: healed scars on thighs Neuro: tearful throughout exam   Assessment and Plan:  [name] is a 18 y.o. female who is here for well care.   #Well teen: -BMI is not appropriate for age -Discussed anticipatory guidance including pregnancy/STI prevention, alcohol/drug use, safety in the car and around water -Screens: Hearing screening result:normal; Vision screening result: normal  #Need for vaccination:  -Counseling provided for all vaccine components  Orders Placed This  Encounter  Procedures   MenQuadfi-Meningococcal (Groups A, C, Y, W) Conjugate Vaccine   POCT Rapid HIV   #Anxiety: - long discussion about mental health. Defer to psychiatry MD. Hildegard Low worker helping with housing as well as potential future directions. Does have an intensive in home therapy program likely lined up  #Headaches:  - on waitlist to see neurology. Discussed before seeing neurology, need to work on sleep. Rx atarax  to help with sleep (works for her in the past). Work on hydration and decreasing stress level.   #Body pains, leg: today discussed the connection between mind and body. Discussed extra weight also is extra stress on joints. Emphasis on healthy eating and focus on sleep currently.    Canda Cera, MD   Spent 60 minutes face to face with patient and > 50% of the visit time was spent on counseling regarding the treatment plan and importance of compliance with chosen management options.

## 2024-05-16 ENCOUNTER — Telehealth: Payer: Self-pay | Admitting: *Deleted

## 2024-05-16 NOTE — Telephone Encounter (Signed)
 Ryka' grandmother Loyde Rule) is requesting a letter to the trauma specialist to emphasize the need for intensive therapy(4 days a week) for the insurance company.

## 2024-05-19 ENCOUNTER — Other Ambulatory Visit (HOSPITAL_COMMUNITY): Payer: Self-pay | Admitting: Psychiatry

## 2024-05-24 ENCOUNTER — Ambulatory Visit: Payer: MEDICAID | Admitting: Nurse Practitioner

## 2024-06-11 ENCOUNTER — Telehealth: Payer: Self-pay

## 2024-06-11 NOTE — Telephone Encounter (Signed)
 Hi Christy, patient's grandmother wants to know when patient is due for the Depo Shot? I tried to look in the chart for the info. She states patient is spotting and was not sure if that means she is due.

## 2024-06-12 ENCOUNTER — Ambulatory Visit: Payer: MEDICAID

## 2024-06-12 ENCOUNTER — Ambulatory Visit: Payer: MEDICAID | Admitting: Nurse Practitioner

## 2024-06-12 VITALS — BP 136/80 | Temp 98.1°F | Ht 64.0 in | Wt 205.0 lb

## 2024-06-12 DIAGNOSIS — Z3042 Encounter for surveillance of injectable contraceptive: Secondary | ICD-10-CM | POA: Diagnosis not present

## 2024-06-12 DIAGNOSIS — Z09 Encounter for follow-up examination after completed treatment for conditions other than malignant neoplasm: Secondary | ICD-10-CM

## 2024-06-12 MED ORDER — MEDROXYPROGESTERONE ACETATE 150 MG/ML IM SUSP
150.0000 mg | Freq: Once | INTRAMUSCULAR | Status: AC
  Administered 2024-06-12: 150 mg via INTRAMUSCULAR

## 2024-06-12 NOTE — Progress Notes (Unsigned)
 Patient here for depo shot. Patient is outside of depo window. Complete pregnancy test, it was negative. Gave depo shot in right deltoid, tolerated well.

## 2024-06-12 NOTE — Telephone Encounter (Signed)
 Opened in error

## 2024-06-14 DIAGNOSIS — Z09 Encounter for follow-up examination after completed treatment for conditions other than malignant neoplasm: Secondary | ICD-10-CM | POA: Insufficient documentation

## 2024-06-17 ENCOUNTER — Other Ambulatory Visit: Payer: MEDICAID

## 2024-06-17 ENCOUNTER — Encounter: Payer: Self-pay | Admitting: Neurology

## 2024-06-17 ENCOUNTER — Ambulatory Visit (INDEPENDENT_AMBULATORY_CARE_PROVIDER_SITE_OTHER): Payer: MEDICAID | Admitting: Neurology

## 2024-06-17 VITALS — BP 106/55 | HR 96 | Ht 65.0 in

## 2024-06-17 DIAGNOSIS — R52 Pain, unspecified: Secondary | ICD-10-CM | POA: Diagnosis not present

## 2024-06-17 DIAGNOSIS — G43009 Migraine without aura, not intractable, without status migrainosus: Secondary | ICD-10-CM | POA: Diagnosis not present

## 2024-06-17 DIAGNOSIS — R202 Paresthesia of skin: Secondary | ICD-10-CM | POA: Diagnosis not present

## 2024-06-17 MED ORDER — SUMATRIPTAN SUCCINATE 50 MG PO TABS: 50.0000 mg | ORAL_TABLET | ORAL | 5 refills | Status: DC | PRN

## 2024-06-17 MED ORDER — AIMOVIG 140 MG/ML ~~LOC~~ SOAJ: 140.0000 mg | SUBCUTANEOUS | 11 refills | Status: DC
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
## 2024-06-17 NOTE — Addendum Note (Signed)
 Addended by: OZELL JESUSA PARAS on: 06/17/2024 10:50 AM   Modules accepted: Orders

## 2024-06-17 NOTE — Patient Instructions (Addendum)
 Start Aimovig 140mg  every 28 days. STOP TYLENOL  AND MOTRIN .  Take sumatriptan 50mg  at earliest onset of headache.  May repeat dose once in 2 hours if needed.  Maximum 2 tablets in 24 hours.   Take ondansetron  for nausea Limit use of sumatriptan to no more than 9 days out of the month.  ggers Routine exercise Stay adequately hydrated (aim for 64 oz water daily) Keep headache diary Maintain proper stress management Maintain proper sleep hygiene Do not skip meals Check ANA with ENA panel Check MRI of brain with and without contrast

## 2024-06-19 ENCOUNTER — Ambulatory Visit: Payer: Self-pay | Admitting: Neurology

## 2024-06-19 DIAGNOSIS — R52 Pain, unspecified: Secondary | ICD-10-CM

## 2024-06-19 DIAGNOSIS — R768 Other specified abnormal immunological findings in serum: Secondary | ICD-10-CM

## 2024-06-19 LAB — ANTI-NUCLEAR AB-TITER (ANA TITER): ANA Titer 1: 1:80 {titer} — ABNORMAL HIGH

## 2024-06-19 LAB — ANA, IFA COMPREHENSIVE PANEL
Anti Nuclear Antibody (ANA): POSITIVE — AB
ENA SM Ab Ser-aCnc: <1 AI
SM/RNP: <1 AI
SSA (Ro) (ENA) Antibody, IgG: <1 AI
SSB (La) (ENA) Antibody, IgG: <1 AI
Scleroderma (Scl-70) (ENA) Antibody, IgG: <1 AI
ds DNA Ab: 1 [IU]/mL

## 2024-06-20 NOTE — Telephone Encounter (Signed)
-----   Message from Juliene Lamar Dunnings sent at 06/19/2024  5:31 PM EDT ----- Blood work revealed a borderline positive ANA which is a nonspecific test that may indicated an autoimmune disease but also may be of no clinical significance.  The more specific autoimmune tests  were negative and since the ANA is only borderline elevated, it may be of uncertain significance.  However, since she is experiencing body aches, would like to refer to rheumatology for BODY ACHES  and POSITIVE ANA ----- Message ----- From: Interface, [facility] Results In Sent: 06/19/2024   3:01 PM EDT To: Juliene JONELLE Dunnings, DO

## 2024-06-20 NOTE — Telephone Encounter (Signed)
 Rheumatology referral added. Spoke to patient grandmother.

## 2024-07-04 ENCOUNTER — Ambulatory Visit: Payer: MEDICAID | Admitting: Obstetrics and Gynecology

## 2024-07-09 ENCOUNTER — Ambulatory Visit: Payer: MEDICAID | Admitting: Radiology

## 2024-07-11 ENCOUNTER — Encounter: Payer: Self-pay | Admitting: Radiology

## 2024-07-11 ENCOUNTER — Ambulatory Visit (INDEPENDENT_AMBULATORY_CARE_PROVIDER_SITE_OTHER): Payer: MEDICAID | Admitting: Radiology

## 2024-07-11 VITALS — BP 134/86 | HR 119 | Ht 66.0 in | Wt 209.6 lb

## 2024-07-11 DIAGNOSIS — R102 Pelvic and perineal pain: Secondary | ICD-10-CM

## 2024-07-11 DIAGNOSIS — N3944 Nocturnal enuresis: Secondary | ICD-10-CM | POA: Diagnosis not present

## 2024-07-11 DIAGNOSIS — N898 Other specified noninflammatory disorders of vagina: Secondary | ICD-10-CM | POA: Diagnosis not present
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
## 2024-07-12 LAB — SURESWAB® ADVANCED VAGINITIS PLUS,TMA
C. trachomatis RNA, TMA: NOT DETECTED
CANDIDA SPECIES: NOT DETECTED
Candida glabrata: NOT DETECTED
N. gonorrhoeae RNA, TMA: NOT DETECTED
SURESWAB(R) ADV BACTERIAL VAGINOSIS(BV),TMA: POSITIVE — AB
TRICHOMONAS VAGINALIS (TV),TMA: NOT DETECTED

## 2024-07-13 LAB — URINALYSIS, COMPLETE W/RFL CULTURE
Bilirubin Urine: NEGATIVE
Casts: NONE SEEN /LPF
Crystals: NONE SEEN /HPF
Glucose, UA: NEGATIVE
Hgb urine dipstick: NEGATIVE
Nitrites, Initial: NEGATIVE
Protein, ur: NEGATIVE
RBC / HPF: NONE SEEN /HPF (ref 0–2)
Specific Gravity, Urine: 1.015 (ref 1.001–1.035)
Yeast: NONE SEEN /HPF
pH: 7 (ref 5.0–8.0)

## 2024-07-13 LAB — URINE CULTURE
MICRO NUMBER:: 16711965
SPECIMEN QUALITY:: ADEQUATE

## 2024-07-13 LAB — PREGNANCY, URINE: Preg Test, Ur: NEGATIVE

## 2024-07-13 LAB — CULTURE INDICATED

## 2024-07-15 ENCOUNTER — Ambulatory Visit: Payer: Self-pay | Admitting: Radiology

## 2024-07-16 MED ORDER — METRONIDAZOLE 500 MG PO TABS: 500.0000 mg | ORAL_TABLET | Freq: Two times a day (BID) | ORAL | 0 refills | Status: AC

## 2024-08-27 ENCOUNTER — Encounter: Payer: Self-pay | Admitting: Neurology

## 2024-08-31 ENCOUNTER — Other Ambulatory Visit: Payer: MEDICAID

## 2024-09-12 ENCOUNTER — Telehealth: Payer: Self-pay | Admitting: Neurology

## 2024-09-13 ENCOUNTER — Ambulatory Visit: Payer: MEDICAID | Admitting: Radiology

## 2024-09-13 VITALS — BP 136/88 | HR 81 | Wt 218.0 lb

## 2024-09-13 DIAGNOSIS — Z3042 Encounter for surveillance of injectable contraceptive: Secondary | ICD-10-CM

## 2024-09-13 DIAGNOSIS — N3944 Nocturnal enuresis: Secondary | ICD-10-CM
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
## 2024-09-16 ENCOUNTER — Telehealth: Payer: Self-pay | Admitting: Neurology

## 2024-09-16 ENCOUNTER — Ambulatory Visit: Payer: Self-pay | Admitting: Neurology

## 2024-09-16 ENCOUNTER — Telehealth: Payer: Self-pay

## 2024-09-16 NOTE — Telephone Encounter (Signed)
 Peer to Peer couldn't be scheduled for an appt time, Wednesday will be 7 days for review. I advised to [facility] and she will call back on  this peer to peer.  Tracking number [id number], reference call number for today [phone].

## 2024-09-16 NOTE — Telephone Encounter (Signed)
 PEER-PEER is needed.  Referral for whole body pain and positive ANA sent to Rheumatology.

## 2024-09-16 NOTE — Telephone Encounter (Signed)
 PT.s grandmother would like to discuss options fro MRI after Denial and her Granddaughters excessive pain

## 2024-09-17 NOTE — Telephone Encounter (Signed)
 Tried calling Medicaid to do an PEER-PEER [phone]  Tracking # [id number]   Per Rep this is only a consult not a PEER-PEER and it will not change the out come of the denial.   Dr.Jaffe spoke to a rep and gave his reasoning behind doing an MRI Brain W/WO contrast and was advised he may start the Appeal process.    LMOVM for patient and Grandmother with information above and to come by the office to sign the forms to start the process.

## 2024-09-19 ENCOUNTER — Telehealth: Payer: Self-pay | Admitting: Neurology

## 2024-09-19 NOTE — Telephone Encounter (Signed)
 Tried calling patient and grandmother please stop by the office to fill out appeal form so we can start the process.

## 2024-09-19 NOTE — Telephone Encounter (Signed)
 See other encounter

## 2024-09-19 NOTE — Telephone Encounter (Signed)
 [name] spencer called and LMOM- wants to speak with sheena regarding an MRI that was denied

## 2024-09-26 ENCOUNTER — Ambulatory Visit (INDEPENDENT_AMBULATORY_CARE_PROVIDER_SITE_OTHER): Payer: MEDICAID | Admitting: Family

## 2024-09-26 ENCOUNTER — Other Ambulatory Visit (HOSPITAL_COMMUNITY)
Admission: RE | Admit: 2024-09-26 | Discharge: 2024-09-26 | Disposition: A | Payer: MEDICAID | Source: Ambulatory Visit | Attending: Family | Admitting: Family

## 2024-09-26 ENCOUNTER — Encounter: Payer: Self-pay | Admitting: Family

## 2024-09-26 VITALS — BP 132/88 | HR 89 | Ht 65.63 in | Wt 220.0 lb

## 2024-09-26 DIAGNOSIS — Z3202 Encounter for pregnancy test, result negative: Secondary | ICD-10-CM | POA: Diagnosis not present

## 2024-09-26 DIAGNOSIS — Z113 Encounter for screening for infections with a predominantly sexual mode of transmission: Secondary | ICD-10-CM | POA: Diagnosis present

## 2024-09-26 DIAGNOSIS — Z5181 Encounter for therapeutic drug level monitoring: Secondary | ICD-10-CM | POA: Diagnosis not present

## 2024-09-26 DIAGNOSIS — E669 Obesity, unspecified: Secondary | ICD-10-CM

## 2024-09-26 DIAGNOSIS — N3944 Nocturnal enuresis: Secondary | ICD-10-CM

## 2024-09-26 DIAGNOSIS — E559 Vitamin D deficiency, unspecified: Secondary | ICD-10-CM

## 2024-09-26 DIAGNOSIS — Z3042 Encounter for surveillance of injectable contraceptive: Secondary | ICD-10-CM

## 2024-09-26 LAB — POCT URINALYSIS DIPSTICK
Bilirubin, UA: NEGATIVE
Blood, UA: NEGATIVE
Glucose, UA: NEGATIVE
Ketones, UA: NEGATIVE
Nitrite, UA: NEGATIVE
Protein, UA: POSITIVE — AB
Spec Grav, UA: 1.01 (ref 1.010–1.025)
Urobilinogen, UA: 0.2 U/dL
pH, UA: 7.5 (ref 5.0–8.0)

## 2024-09-26 LAB — POCT URINE PREGNANCY: Preg Test, Ur: NEGATIVE

## 2024-09-26 MED ORDER — MEDROXYPROGESTERONE ACETATE 150 MG/ML IM SUSP
150.0000 mg | Freq: Once | INTRAMUSCULAR | Status: AC
  Administered 2024-09-26: 150 mg via INTRAMUSCULAR
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
## 2024-09-27 LAB — URINE CYTOLOGY ANCILLARY ONLY
Chlamydia: NEGATIVE
Comment: NEGATIVE
Comment: NEGATIVE
Comment: NORMAL
Neisseria Gonorrhea: NEGATIVE
Trichomonas: NEGATIVE

## 2024-09-27 NOTE — Progress Notes (Signed)
 Completed. Grandmom states she can bring her on Monday afternoon.

## 2024-09-30 ENCOUNTER — Other Ambulatory Visit: Payer: MEDICAID

## 2024-09-30 ENCOUNTER — Telehealth: Payer: Self-pay | Admitting: Neurology

## 2024-09-30 NOTE — Telephone Encounter (Signed)
 Pt. Would like a call bkfor next steps as MRI cncld, New DPR completed

## 2024-09-30 NOTE — Telephone Encounter (Signed)
 Patient grandmother advised Appeal form is needed.

## 2024-10-01 LAB — COMPREHENSIVE METABOLIC PANEL WITH GFR
AG Ratio: 1.4 (calc) (ref 1.0–2.5)
ALT: 17 U/L (ref 5–32)
AST: 14 U/L (ref 12–32)
Albumin: 4.2 g/dL (ref 3.6–5.1)
Alkaline phosphatase (APISO): 53 U/L (ref 36–128)
BUN: 13 mg/dL (ref 7–20)
CO2: 22 mmol/L (ref 20–32)
Calcium: 9.2 mg/dL (ref 8.9–10.4)
Chloride: 104 mmol/L (ref 98–110)
Creat: 0.73 mg/dL (ref 0.50–0.96)
Globulin: 3.1 g/dL (ref 2.0–3.8)
Glucose, Bld: 85 mg/dL (ref 65–99)
Potassium: 4.4 mmol/L (ref 3.8–5.1)
Sodium: 136 mmol/L (ref 135–146)
Total Bilirubin: 0.3 mg/dL (ref 0.2–1.1)
Total Protein: 7.3 g/dL (ref 6.3–8.2)
eGFR: 121 mL/min/1.73m2 (ref >=60)

## 2024-10-01 LAB — LIPID PANEL
Cholesterol: 118 mg/dL (ref <170)
HDL: 48 mg/dL (ref >45)
LDL Cholesterol (Calc): 54 mg/dL (ref <110)
Non-HDL Cholesterol (Calc): 70 mg/dL (ref <120)
Total CHOL/HDL Ratio: 2.5 (calc) (ref <5.0)
Triglycerides: 79 mg/dL (ref <90)

## 2024-10-01 LAB — CBC WITH DIFFERENTIAL/PLATELET
Absolute Lymphocytes: 2740 {cells}/uL (ref 850–3900)
Absolute Monocytes: 367 {cells}/uL (ref 200–950)
Basophils Absolute: 61 {cells}/uL (ref 0–200)
Basophils Relative: 0.9 %
Eosinophils Absolute: 279 {cells}/uL (ref 15–500)
Eosinophils Relative: 4.1 %
HCT: 38.8 % (ref 35.0–45.0)
Hemoglobin: 12.6 g/dL (ref 11.7–15.5)
MCH: 28.5 pg (ref 27.0–33.0)
MCHC: 32.5 g/dL (ref 32.0–36.0)
MCV: 87.8 fL (ref 80.0–100.0)
MPV: 12.5 fL (ref 7.5–12.5)
Monocytes Relative: 5.4 %
Neutro Abs: 3352 {cells}/uL (ref 1500–7800)
Neutrophils Relative %: 49.3 %
Platelets: 247 Thousand/uL (ref 140–400)
RBC: 4.42 Million/uL (ref 3.80–5.10)
RDW: 13.9 % (ref 11.0–15.0)
Total Lymphocyte: 40.3 %
WBC: 6.8 Thousand/uL (ref 3.8–10.8)

## 2024-10-01 LAB — THYROID PANEL WITH TSH
Free Thyroxine Index: 2.6 (ref 1.4–3.8)
T3 Uptake: 27 % (ref 22–35)
T4, Total: 9.5 ug/dL (ref 5.3–11.7)
TSH: 1.5 m[IU]/L

## 2024-10-01 LAB — HEMOGLOBIN A1C
Hgb A1c MFr Bld: 5.3 % (ref <5.7)
Mean Plasma Glucose: 105 mg/dL
eAG (mmol/L): 5.8 mmol/L

## 2024-10-01 LAB — PROLACTIN: Prolactin: 74.5 ng/mL — ABNORMAL HIGH

## 2024-10-01 LAB — VITAMIN D 25 HYDROXY (VIT D DEFICIENCY, FRACTURES): Vit D, 25-Hydroxy: 38 ng/mL (ref 30–100)

## 2024-10-04 ENCOUNTER — Ambulatory Visit: Payer: Self-pay | Admitting: Family

## 2024-10-07 ENCOUNTER — Emergency Department (HOSPITAL_COMMUNITY)
Admission: EM | Admit: 2024-10-07 | Discharge: 2024-10-07 | Discharge status: Against Medical Advice | Payer: MEDICAID | Attending: Emergency Medicine | Admitting: Emergency Medicine

## 2024-10-07 DIAGNOSIS — Y92410 Unspecified street and highway as the place of occurrence of the external cause: Secondary | ICD-10-CM | POA: Insufficient documentation

## 2024-10-07 DIAGNOSIS — Z5321 Procedure and treatment not carried out due to patient leaving prior to being seen by health care provider: Secondary | ICD-10-CM | POA: Insufficient documentation

## 2024-10-07 DIAGNOSIS — M549 Dorsalgia, unspecified: Secondary | ICD-10-CM | POA: Diagnosis present
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
## 2024-10-07 NOTE — ED Notes (Signed)
 Patient called three times for triage room and did not answer. Triage RN made aware.

## 2024-10-08 NOTE — Telephone Encounter (Signed)
 Patient and grandmother stopped by to sign appeal form.   Form faxed over to [facility] Appeal Department at [phone]

## 2024-10-17 ENCOUNTER — Encounter: Payer: MEDICAID | Admitting: Internal Medicine
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
## 2024-10-28 ENCOUNTER — Telehealth: Payer: Self-pay | Admitting: Neurology

## 2024-10-28 DIAGNOSIS — R202 Paresthesia of skin: Secondary | ICD-10-CM

## 2024-10-28 DIAGNOSIS — R7689 Other specified abnormal immunological findings in serum: Secondary | ICD-10-CM

## 2024-10-28 DIAGNOSIS — R52 Pain, unspecified: Secondary | ICD-10-CM

## 2024-10-28 NOTE — Telephone Encounter (Signed)
 agree

## 2024-10-28 NOTE — Telephone Encounter (Signed)
 Per patient grandmother [name] is in a daze all the time. And pain in her back.    Advised patient at her follow up we will need to document that the patient pain is worse so the MRI can be approved.

## 2024-10-28 NOTE — Telephone Encounter (Signed)
 Pt. Grandmother asking for a new Rheumatology Referral and questions about [employer] Denial of MRI call back number [phone]

## 2024-10-30 NOTE — Telephone Encounter (Signed)
 Rheumatology referral added.  Advised patient grandmother to write down the appt date and time given once she schedule.  [facility].

## 2024-10-30 NOTE — Addendum Note (Signed)
 Addended by: OZELL JESUSA PARAS on: 10/30/2024 10:36 AM   Modules accepted: Orders

## 2024-11-10 ENCOUNTER — Emergency Department (HOSPITAL_BASED_OUTPATIENT_CLINIC_OR_DEPARTMENT_OTHER): Payer: MEDICAID

## 2024-11-10 ENCOUNTER — Other Ambulatory Visit: Payer: Self-pay

## 2024-11-10 ENCOUNTER — Encounter (HOSPITAL_BASED_OUTPATIENT_CLINIC_OR_DEPARTMENT_OTHER): Payer: Self-pay

## 2024-11-10 ENCOUNTER — Emergency Department (HOSPITAL_BASED_OUTPATIENT_CLINIC_OR_DEPARTMENT_OTHER)
Admission: EM | Admit: 2024-11-10 | Discharge: 2024-11-10 | Discharge status: Against Medical Advice | Payer: MEDICAID | Attending: Emergency Medicine | Admitting: Emergency Medicine

## 2024-11-10 DIAGNOSIS — Z23 Encounter for immunization: Secondary | ICD-10-CM | POA: Diagnosis not present

## 2024-11-10 DIAGNOSIS — Z5321 Procedure and treatment not carried out due to patient leaving prior to being seen by health care provider: Secondary | ICD-10-CM | POA: Diagnosis not present

## 2024-11-10 DIAGNOSIS — W268XXA Contact with other sharp object(s), not elsewhere classified, initial encounter: Secondary | ICD-10-CM | POA: Insufficient documentation

## 2024-11-10 DIAGNOSIS — S51811A Laceration without foreign body of right forearm, initial encounter: Secondary | ICD-10-CM | POA: Insufficient documentation

## 2024-11-10 DIAGNOSIS — S59911A Unspecified injury of right forearm, initial encounter: Secondary | ICD-10-CM | POA: Diagnosis present

## 2024-11-10 MED ORDER — TETANUS-DIPHTH-ACELL PERTUSSIS 5-2-15.5 LF-MCG/0.5 IM SUSP
0.5000 mL | Freq: Once | INTRAMUSCULAR | Status: AC
  Administered 2024-11-10: 0.5 mL via INTRAMUSCULAR
  Filled 2024-11-10: qty 0.5

## 2024-11-10 MED ORDER — LIDOCAINE HCL 2 % IJ SOLN: 20.0000 mL | Freq: Once | INTRAMUSCULAR | Status: DC

## 2024-11-10 MED ORDER — LIDOCAINE-EPINEPHRINE 2 %-1:100000 IJ SOLN: 20.0000 mL | Freq: Once | INTRAMUSCULAR | Status: DC

## 2024-11-10 MED ORDER — ACETAMINOPHEN 500 MG PO TABS: 1000.0000 mg | ORAL_TABLET | Freq: Once | ORAL | Status: DC

## 2024-11-10 MED ORDER — LIDOCAINE-EPINEPHRINE (PF) 2 %-1:200000 IJ SOLN
INTRAMUSCULAR | Status: AC
  Filled 2024-11-10: qty 20

## 2024-11-10 NOTE — ED Triage Notes (Signed)
 Arrives ambulatory to the ED with complaints of lacerations to right forearm related smashing pottery prior to arrival due being upset.

## 2024-11-10 NOTE — ED Provider Notes (Signed)
 [place] EMERGENCY DEPARTMENT AT [facility] Provider Note   CSN: [id number] Arrival date & time: 11/10/24  8175     Patient presents with: Extremity Laceration (Right arm )   [name] is a 18 y.o. female presents to the emergency department for evaluation of a right arm laceration.  Patient reports she was at home smashing pottery as recommended by her therapist for emotional release when broken pieces struck her in her right arm, causing multiple lacerations.  She states she was able to control the bleeding at home and denies any significant blood loss.  No reported numbness, tingling, weakness, or concern for vascular injury.  She reports mild soreness at the site but no other complaints.  On arrival she is noted to have 3 lacerations to the right arm 2 lacerations are approximately 4cm in length and appear to require suture repair, the third laceration is superficial and does not appear to require closure. Patient denies SI/HI.   HPI     Prior to Admission medications   Medication Sig Start Date End Date Taking? Authorizing Provider  albuterol  (VENTOLIN  HFA) 108 (90 Base) MCG/ACT inhaler Inhale 2 puffs into the lungs every 6 (six) hours as needed for wheezing or shortness of breath.    [provider]  ARIPiprazole  ER (ABILIFY  MAINTENA) 400 MG PRSY prefilled syringe Inject 400 mg into the muscle every 28 (twenty-eight) days. Patient not taking: Reported on 09/13/2024 03/21/24   Tex Drilling, NP  divalproex (DEPAKOTE) 500 MG DR tablet Take 500 mg by mouth 2 (two) times daily. 03/30/24   [provider]  Erenumab -aooe (AIMOVIG ) 140 MG/ML SOAJ Inject 140 mg into the skin every 28 (twenty-eight) days. Patient not taking: Reported on 09/13/2024 06/17/24   Skeet Juliene JONELLE, DO  FLUoxetine  (PROZAC ) 40 MG capsule Take 1 capsule (40 mg total) by mouth every morning. Patient not taking: Reported on 09/13/2024 02/22/24   Tex Drilling, NP  hydrOXYzine  (ATARAX ) 25 MG  tablet Take 2 tablets (50 mg total) by mouth every 6 (six) hours as needed for anxiety (up to 3 times per day). 05/15/24   Gretel Andes, MD  hydrOXYzine  (VISTARIL ) 25 MG capsule Take 25 mg by mouth every 6 (six) hours as needed. 03/30/24   [provider]  ibuprofen  (ADVIL ) 200 MG tablet Take 200 mg by mouth every 6 (six) hours as needed for mild pain (pain score 1-3).    [provider]  metroNIDAZOLE  (FLAGYL ) 500 MG tablet Take 1 tablet (500 mg total) by mouth 2 (two) times daily. Patient not taking: Reported on 09/13/2024 07/16/24   Chrzanowski, Jami B, NP  ondansetron  (ZOFRAN -ODT) 4 MG disintegrating tablet Take 4 mg by mouth 3 (three) times daily. 05/30/24   [provider]  phenazopyridine  (PYRIDIUM ) 200 MG tablet Take 1 tablet (200 mg total) by mouth 3 (three) times daily. Patient not taking: Reported on 09/13/2024 03/15/24   Small, Brooke L, PA  polyethylene glycol powder (GLYCOLAX /MIRALAX ) 17 GM/SCOOP powder Use as directed. 04/15/24   Molt Bari HERO, NP  propranolol  (INDERAL ) 10 MG tablet Take 10 mg by mouth every 4 (four) hours as needed. 04/26/24   [provider]  QUEtiapine  (SEROQUEL ) 200 MG tablet Take 200 mg by mouth at bedtime. Patient not taking: Reported on 09/13/2024 04/17/24   [provider]  risperiDONE (RISPERDAL) 1 MG tablet Take 1 mg by mouth 2 (two) times daily. 03/30/24   [provider]  SUMAtriptan  (IMITREX ) 50 MG tablet Take 1 tablet (50  mg total) by mouth as needed for migraine. May repeat in 2 hours if headache persists or recurs.  Maximum 2 tablets in 24 hours. 06/17/24   Skeet Juliene SAUNDERS, DO  SYMBICORT  80-4.5 MCG/ACT inhaler Inhale 2 puffs into the lungs 2 (two) times daily. Patient not taking: Reported on 09/13/2024    [provider]  traZODone  (DESYREL ) 50 MG tablet Take 50 mg by mouth at bedtime. 05/17/24   [provider]    Allergies: Apple juice, Fish protein-containing drug products, Other,  Peanut-containing drug products, Shellfish allergy, Banana, Abilify  [aripiprazole ], and Watermelon [citrullus vulgaris]    Review of Systems  Skin:  Positive for wound.    Updated Vital Signs BP (!) 150/96 (BP Location: Left Arm)   Pulse (!) 109   Temp 97.7 F (36.5 C)   Resp 18   Ht 5' 5.6 (1.666 m)   Wt 99.8 kg   SpO2 100%   BMI 35.95 kg/m   Physical Exam Vitals and nursing note reviewed.  Constitutional:      General: She is not in acute distress.    Appearance: Normal appearance.  HENT:     Head: Normocephalic and atraumatic.  Eyes:     Extraocular Movements: Extraocular movements intact.     Conjunctiva/sclera: Conjunctivae normal.     Pupils: Pupils are equal, round, and reactive to light.  Cardiovascular:     Rate and Rhythm: Regular rhythm. Tachycardia present.     Pulses: Normal pulses.  Pulmonary:     Effort: Pulmonary effort is normal. No respiratory distress.  Musculoskeletal:        General: Normal range of motion.     Cervical back: Normal range of motion.     Comments: Full ROM of right arm, elbow, wrist.  No deformity.  Neurovascular intact.  Skin:    General: Skin is warm and dry.     Capillary Refill: Capillary refill takes less than 2 seconds.     Comments: 3 lacerations to the right arm: Proximal laceration: Approximately 4 cm, edges clean, mild swelling, no significant contamination. Distal laceration: 4 cm, similar appearance, mild swelling. Third laceration adjacent to other 2 lacerations, superficial, not gaping, no need for closure. Minimal blood oozing from wound after removing bandage, no significant blood loss.  No significant surrounding edema or erythema.   Neurological:     General: No focal deficit present.     Mental Status: She is alert. Mental status is at baseline.  Psychiatric:        Mood and Affect: Mood normal.     (all labs ordered are listed, but only abnormal results are displayed) Labs Reviewed - No data to  display  EKG: None  Radiology: No results found.   .Laceration Repair  Date/Time: 11/10/2024 10:52 PM  Performed by: Willma Duwaine CROME, PA Authorized by: Willma Duwaine CROME, PA   Consent:    Consent obtained:  Verbal   Consent given by:  Patient   Risks, benefits, and alternatives were discussed: yes     Risks discussed:  Infection, pain, need for additional repair, poor cosmetic result, nerve damage, poor wound healing and vascular damage   Alternatives discussed:  No treatment, delayed treatment, observation and referral Universal protocol:    Procedure explained and questions answered to patient or proxy's satisfaction: yes     Required blood products, implants, devices, and special equipment available: no     Site/side marked: no     Immediately prior to procedure, a time  out was called: yes     Patient identity confirmed:  Verbally with patient, hospital-assigned identification number and arm band Anesthesia:    Anesthesia method:  Local infiltration   Local anesthetic:  Lidocaine  2% WITH epi Laceration details:    Location:  Shoulder/arm   Shoulder/arm location:  R lower arm   Length (cm):  8 (two lacerations, both 4cm in length)   Depth (mm):  2 Pre-procedure details:    Preparation:  Patient was prepped and draped in usual sterile fashion Exploration:    Limited defect created (wound extended): no     Hemostasis achieved with:  Epinephrine  and direct pressure   Imaging obtained comment:  Patient refused xray   Imaging outcome: foreign body not noted     Wound exploration: entire depth of wound visualized     Wound extent: fascia not violated, no foreign body, no signs of injury, no nerve damage, no tendon damage, no underlying fracture and no vascular damage     Contaminated: no   Treatment:    Area cleansed with:  Soap and water   Amount of cleaning:  Extensive   Irrigation solution:  Sterile saline   Irrigation volume:  500cc   Irrigation method:  Pressure wash    Debridement:  None Skin repair:    Repair method:  Sutures   Suture size:  4-0   Suture material:  Nylon   Suture technique:  Simple interrupted   Number of sutures:  10 (laceration 1 recieved 4 sutures, laceration 2 recieved 6 sutures) Approximation:    Approximation:  Close Repair type:    Repair type:  Simple Post-procedure details:    Dressing:  Open (no dressing)   Procedure completion:  Tolerated well, no immediate complications Comments:     Two lacerations within close proximity were closed as noted above.     Medications Ordered in the ED  Tdap (ADACEL ) injection 0.5 mL (0.5 mLs Intramuscular Given 11/10/24 2035)  lidocaine -EPINEPHrine  (XYLOCAINE  W/EPI) 2 %-1:200000 (PF) injection (  Given 11/10/24 2135)                                    Medical Decision Making Amount and/or Complexity of Data Reviewed Radiology: ordered.  Risk OTC drugs. Prescription drug management.   Patient presents to the ED for concern of right arm laceration, this involves an extensive number of treatment options, and is a complaint that carries with it a high risk of complications and morbidity.    Medicines ordered and prescription drug management: I ordered medications: Tdap injection  Test Considered: Diagnostic testing was considered based on the patient's presenting symptoms, risk factors, and initial clinical assessment.  Right arm imaging was ordered due to mechanism of injury, however, the patient adamantly refused to undergo x-ray despite multiple attempts by nursing staff to explain the purpose and importance of the study.  Nursing staff notified me, and although the patient was reapproached and encouraged, she continued to refuse all imaging. The approach to diagnostic testing prioritized exclusion of life-threatening conditions  Problem List / ED Course: Problem List: Right arm laceration Emergency Department Course: The patient presented with right arm laceration.  Initial assessment included history, physical exam, and review of prior medical records.  On exam, no evidence of neurovascular injury, tendon involvement, or foreign body based. Wounds were clean, bleeding controlled, and appropriate for simple suture repair.  2 lacerations were repaired using  local anesthetic, and 1 superficial laceration required no intervention.  Imaging was attempted however patient refused.  Patient tolerated the suture closure well.  After completion of the procedure, patient was instructed that nursing staff would apply dressing, provide wound care education, review discharge instructions, and obtain discharge vital signs, and administer tylenol  for pain.  Before this could occur the patient informed the staff she was leaving and walked out of the ED despite attempts by nursing staff to speak with her.  She left without receiving dressing application, wound care instructions, or written discharge instructions.  She was verbally informed prior to leaving that sutures should be removed in 7 to 10 days and that she would monitor for signs of infection such as redness, warmth, drainage, fever, or increased pain.  Documented elopement/left prior to completion of treatment.  Reevaluation: Unable to complete final reevaluation due to patient elopement.  Dispostion: Patient eloped from the ED prior to completion of treatment, leaving for wound care, discharge instructions, and final vital signs could be completed.  She was verbally advised regarding suture removal and warning signs prior to departure by myself, but left the department AMA and without receiving formal discharge paperwork.     Final diagnoses:  None    ED Discharge Orders     None          Willma Duwaine CROME, [place] 11/19/24 1008    Rogelia Jerilynn RAMAN, MD 11/19/24 1547

## 2024-11-10 NOTE — ED Notes (Signed)
 Went to update vitals and perform wound care and apply dressing to arm and administer tylenol  and pt met me in the hall stating that she was leaving and did not want to stay any longer.She states, I don't need your tylenol . I have tylenol  at home and I don't need anything else. I am leaving. Provider made aware

## 2024-11-19 ENCOUNTER — Telehealth: Payer: Self-pay

## 2024-11-19 ENCOUNTER — Ambulatory Visit: Payer: MEDICAID | Admitting: Family

## 2024-11-19 VITALS — BP 114/65 | HR 85 | Ht 64.57 in | Wt 220.8 lb

## 2024-11-19 DIAGNOSIS — Z4802 Encounter for removal of sutures: Secondary | ICD-10-CM

## 2024-11-19 DIAGNOSIS — S51811A Laceration without foreign body of right forearm, initial encounter: Secondary | ICD-10-CM | POA: Insufficient documentation

## 2024-11-19 DIAGNOSIS — S51811D Laceration without foreign body of right forearm, subsequent encounter: Secondary | ICD-10-CM

## 2024-11-19 MED ORDER — DOXYCYCLINE HYCLATE 100 MG PO TABS: 100.0000 mg | ORAL_TABLET | Freq: Two times a day (BID) | ORAL | 0 refills | Status: AC
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
## 2024-11-19 NOTE — Telephone Encounter (Signed)
 Spoke with patient's grandmother Sharyne. Relayed MD message to take doxycycline  that was sent to pharmacy as directed. Nurse instructed gm to have patient take wrap and guaze off arm tomorrow at lunch; keep the steri-strips on.   [name] OK with this plan, states someone did call for f/u appt on Monday.  We discussed symptoms such as fever, chills, swelling, warmth, streaks of redness or increased discharge or pain at the site, go to ED to be seen. [name] states understanding.

## 2024-11-20 ENCOUNTER — Telehealth: Payer: Self-pay

## 2024-11-20 ENCOUNTER — Encounter: Payer: Self-pay | Admitting: Family

## 2024-11-20 ENCOUNTER — Telehealth: Payer: MEDICAID | Admitting: Family

## 2024-11-20 NOTE — Telephone Encounter (Signed)
 Called and spoke with grandmother who gave me [name] direct number as she states she is not with her. Spoke with [name] and she states she did remove the coban and gauze, the steri strips are in place and she denies any swelling, redness or warmth to site. She states it looks like it is healing ok. She states her grandmother is picking up the antibiotics today and she plans to start taking them today.

## 2024-11-25 ENCOUNTER — Telehealth: Payer: Self-pay

## 2024-11-25 ENCOUNTER — Telehealth: Payer: MEDICAID | Admitting: Pediatrics

## 2024-11-25 NOTE — Telephone Encounter (Signed)
 Mutual agreement made by MD and RN to cancel video visit scheduled today, due to the reason of visit would be most appropriate in person. The nurse contacted the patient's grandmother, who reported no concerns at this time and stated that the site appears normal with no drainage. An attempt was made to reach the patient directly; there was no answer. A voicemail was left requesting a return call for any questions or concerns, which will be communicated to the MD as needed. She was also informed along with parent to follow up with us  in person for any concerns to the site.

## 2024-11-25 NOTE — Telephone Encounter (Signed)
 Spoke with MD in regards to this patient

## 2024-11-27 ENCOUNTER — Telehealth: Payer: MEDICAID | Admitting: Family

## 2024-11-27 ENCOUNTER — Encounter: Payer: Self-pay | Admitting: Family

## 2024-11-27 DIAGNOSIS — S51811D Laceration without foreign body of right forearm, subsequent encounter: Secondary | ICD-10-CM
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
## 2024-12-30 ENCOUNTER — Encounter: Payer: Self-pay | Admitting: Family

## 2024-12-30 ENCOUNTER — Ambulatory Visit: Payer: MEDICAID | Admitting: Family

## 2024-12-30 ENCOUNTER — Telehealth: Payer: Self-pay

## 2024-12-30 VITALS — BP 121/78 | HR 87 | Ht 65.0 in | Wt 225.4 lb

## 2024-12-30 DIAGNOSIS — Z3042 Encounter for surveillance of injectable contraceptive: Secondary | ICD-10-CM | POA: Diagnosis not present

## 2024-12-30 MED ORDER — MEDROXYPROGESTERONE ACETATE 150 MG/ML IM SUSP
150.0000 mg | Freq: Once | INTRAMUSCULAR | Status: AC
  Administered 2024-12-30: 150 mg via INTRAMUSCULAR

## 2024-12-30 NOTE — Telephone Encounter (Signed)
 Opened in error

## 2024-12-30 NOTE — Progress Notes (Signed)
 Pt presents for depo injection. Pt within depo window, no urine hcg needed.  Injection given, tolerated well. F/u depo injection visit scheduled.    First depo injection: 02/22/22 Due for bone density: 02/23/24  Patient last 3 weights:  Wt Readings from Last 3 Encounters:  12/30/24 225 lb 6.4 oz (102.2 kg) (99%, Z= 2.27)*  11/19/24 220 lb 12.8 oz (100.2 kg) (99%, Z= 2.22)*  11/10/24 220 lb 0.3 oz (99.8 kg) (99%, Z= 2.21)*   * Growth percentiles are based on CDC (Girls, 2-20 Years) data.     Last Blood Pressure:   BP Readings from Last 3 Encounters:  12/30/24 121/78  11/19/24 114/65  11/10/24 (!) 150/96
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
## 2025-01-21 ENCOUNTER — Telehealth: Payer: MEDICAID | Admitting: Neurology

## 2025-01-21 ENCOUNTER — Encounter: Payer: Self-pay | Admitting: Neurology

## 2025-01-21 ENCOUNTER — Telehealth: Payer: Self-pay

## 2025-01-21 DIAGNOSIS — G43009 Migraine without aura, not intractable, without status migrainosus: Secondary | ICD-10-CM | POA: Diagnosis not present

## 2025-01-21 DIAGNOSIS — R52 Pain, unspecified: Secondary | ICD-10-CM

## 2025-01-21 DIAGNOSIS — R7689 Other specified abnormal immunological findings in serum: Secondary | ICD-10-CM | POA: Diagnosis not present

## 2025-01-21 MED ORDER — ONDANSETRON HCL 4 MG PO TABS: 4.0000 mg | ORAL_TABLET | Freq: Three times a day (TID) | ORAL | 5 refills | Status: AC | PRN

## 2025-01-21 MED ORDER — SUMATRIPTAN SUCCINATE 50 MG PO TABS: 50.0000 mg | ORAL_TABLET | ORAL | 5 refills | Status: AC | PRN

## 2025-01-21 MED ORDER — AIMOVIG 140 MG/ML ~~LOC~~ SOAJ: 140.0000 mg | SUBCUTANEOUS | 11 refills | Status: AC

## 2025-01-21 NOTE — Telephone Encounter (Signed)
 PA needed for Aimovig.

## 2025-01-21 NOTE — Patient Instructions (Signed)
 Start Aimovig  injection every 28 days Sumatriptan  for acute migraine attacks.  Ondansetron  for nausea Refer to rheumatology.

## 2025-01-21 NOTE — Addendum Note (Signed)
 Addended by: OZELL JESUSA PARAS on: 01/21/2025 12:25 PM   Modules accepted: Orders

## 2025-01-28 ENCOUNTER — Telehealth: Payer: Self-pay | Admitting: Pharmacy Technician

## 2025-01-28 ENCOUNTER — Other Ambulatory Visit (HOSPITAL_COMMUNITY): Payer: Self-pay

## 2025-01-28 NOTE — Telephone Encounter (Signed)
 PA has been submitted, and telephone encounter has been created. Please see telephone encounter dated 2.3.26.

## 2025-03-27 ENCOUNTER — Encounter: Payer: MEDICAID | Admitting: Family

## 2025-07-29 ENCOUNTER — Ambulatory Visit: Payer: Self-pay | Admitting: Neurology
